# Patient Record
Sex: Male | Born: 1950 | Race: Black or African American | Hispanic: No | Marital: Married | Smoking: Never smoker
Health system: Southern US, Community
[De-identification: ages and names within clinical notes are randomized; demographics above are authoritative.]

## PROBLEM LIST (undated history)

## (undated) DIAGNOSIS — I10 Essential (primary) hypertension: Secondary | ICD-10-CM

## (undated) DIAGNOSIS — E785 Hyperlipidemia, unspecified: Secondary | ICD-10-CM

## (undated) HISTORY — PX: TOOTH EXTRACTION: SUR596

## (undated) HISTORY — PX: WISDOM TOOTH EXTRACTION: SHX21

## (undated) HISTORY — DX: Hyperlipidemia, unspecified: E78.5

## (undated) HISTORY — DX: Essential (primary) hypertension: I10

## (undated) HISTORY — PX: COLONOSCOPY: SHX174

---

## 2008-06-18 ENCOUNTER — Ambulatory Visit: Payer: Self-pay | Admitting: Gastroenterology

## 2019-09-11 ENCOUNTER — Other Ambulatory Visit: Payer: Self-pay | Admitting: Family Medicine

## 2019-09-11 DIAGNOSIS — R109 Unspecified abdominal pain: Secondary | ICD-10-CM

## 2019-09-12 ENCOUNTER — Other Ambulatory Visit: Payer: Self-pay

## 2019-09-12 ENCOUNTER — Ambulatory Visit
Admission: RE | Admit: 2019-09-12 | Discharge: 2019-09-12 | Disposition: A | Discharge status: Home / Self Care | Payer: Medicare HMO | Source: Ambulatory Visit | Attending: Family Medicine | Admitting: Family Medicine

## 2019-09-12 DIAGNOSIS — R109 Unspecified abdominal pain: Secondary | ICD-10-CM | POA: Insufficient documentation

## 2019-09-19 ENCOUNTER — Inpatient Hospital Stay
Admission: EM | Admit: 2019-09-19 | Discharge: 2019-09-27 | DRG: 871 | Disposition: A | Discharge status: Home / Self Care | Payer: Medicare HMO | Attending: Internal Medicine | Admitting: Internal Medicine

## 2019-09-19 ENCOUNTER — Emergency Department: Payer: Medicare HMO

## 2019-09-19 ENCOUNTER — Encounter: Payer: Self-pay | Admitting: Emergency Medicine

## 2019-09-19 ENCOUNTER — Other Ambulatory Visit: Payer: Self-pay

## 2019-09-19 ENCOUNTER — Observation Stay: Payer: Medicare HMO

## 2019-09-19 DIAGNOSIS — R7989 Other specified abnormal findings of blood chemistry: Secondary | ICD-10-CM

## 2019-09-19 DIAGNOSIS — J90 Pleural effusion, not elsewhere classified: Secondary | ICD-10-CM

## 2019-09-19 DIAGNOSIS — M899 Disorder of bone, unspecified: Secondary | ICD-10-CM | POA: Diagnosis present

## 2019-09-19 DIAGNOSIS — R590 Localized enlarged lymph nodes: Secondary | ICD-10-CM | POA: Diagnosis present

## 2019-09-19 DIAGNOSIS — Z880 Allergy status to penicillin: Secondary | ICD-10-CM

## 2019-09-19 DIAGNOSIS — C3432 Malignant neoplasm of lower lobe, left bronchus or lung: Secondary | ICD-10-CM | POA: Diagnosis present

## 2019-09-19 DIAGNOSIS — R109 Unspecified abdominal pain: Secondary | ICD-10-CM | POA: Diagnosis present

## 2019-09-19 DIAGNOSIS — J9600 Acute respiratory failure, unspecified whether with hypoxia or hypercapnia: Secondary | ICD-10-CM

## 2019-09-19 DIAGNOSIS — Z09 Encounter for follow-up examination after completed treatment for conditions other than malignant neoplasm: Secondary | ICD-10-CM

## 2019-09-19 DIAGNOSIS — Z79899 Other long term (current) drug therapy: Secondary | ICD-10-CM

## 2019-09-19 DIAGNOSIS — Z801 Family history of malignant neoplasm of trachea, bronchus and lung: Secondary | ICD-10-CM

## 2019-09-19 DIAGNOSIS — J869 Pyothorax without fistula: Secondary | ICD-10-CM

## 2019-09-19 DIAGNOSIS — Z20822 Contact with and (suspected) exposure to covid-19: Secondary | ICD-10-CM | POA: Diagnosis present

## 2019-09-19 DIAGNOSIS — A419 Sepsis, unspecified organism: Principal | ICD-10-CM | POA: Diagnosis present

## 2019-09-19 DIAGNOSIS — J9601 Acute respiratory failure with hypoxia: Secondary | ICD-10-CM | POA: Diagnosis not present

## 2019-09-19 DIAGNOSIS — Z938 Other artificial opening status: Secondary | ICD-10-CM

## 2019-09-19 DIAGNOSIS — J9811 Atelectasis: Secondary | ICD-10-CM | POA: Diagnosis present

## 2019-09-19 DIAGNOSIS — R918 Other nonspecific abnormal finding of lung field: Secondary | ICD-10-CM

## 2019-09-19 DIAGNOSIS — Z833 Family history of diabetes mellitus: Secondary | ICD-10-CM

## 2019-09-19 DIAGNOSIS — D72829 Elevated white blood cell count, unspecified: Secondary | ICD-10-CM | POA: Diagnosis not present

## 2019-09-19 DIAGNOSIS — Z87891 Personal history of nicotine dependence: Secondary | ICD-10-CM

## 2019-09-19 DIAGNOSIS — R079 Chest pain, unspecified: Secondary | ICD-10-CM

## 2019-09-19 DIAGNOSIS — J189 Pneumonia, unspecified organism: Secondary | ICD-10-CM | POA: Diagnosis present

## 2019-09-19 DIAGNOSIS — E1165 Type 2 diabetes mellitus with hyperglycemia: Secondary | ICD-10-CM | POA: Diagnosis present

## 2019-09-19 LAB — FIBRIN DERIVATIVES D-DIMER (ARMC ONLY): Fibrin derivatives D-dimer (ARMC): 1020.68 ng/mL (FEU) — ABNORMAL HIGH (ref 0.00–499.00)

## 2019-09-19 LAB — COMPREHENSIVE METABOLIC PANEL
ALT: 28 U/L (ref 0–44)
AST: 22 U/L (ref 15–41)
Albumin: 3.7 g/dL (ref 3.5–5.0)
Alkaline Phosphatase: 110 U/L (ref 38–126)
Anion gap: 12 (ref 5–15)
BUN: 21 mg/dL (ref 8–23)
CO2: 28 mmol/L (ref 22–32)
Calcium: 8.9 mg/dL (ref 8.9–10.3)
Chloride: 99 mmol/L (ref 98–111)
Creatinine, Ser: 1.26 mg/dL — ABNORMAL HIGH (ref 0.61–1.24)
GFR calc Af Amer: >60 mL/min (ref >60)
GFR calc non Af Amer: 59 mL/min — ABNORMAL LOW (ref >60)
Glucose, Bld: 217 mg/dL — ABNORMAL HIGH (ref 70–99)
Potassium: 3.6 mmol/L (ref 3.5–5.1)
Sodium: 139 mmol/L (ref 135–145)
Total Bilirubin: 1 mg/dL (ref 0.3–1.2)
Total Protein: 8.2 g/dL — ABNORMAL HIGH (ref 6.5–8.1)

## 2019-09-19 LAB — CBC WITH DIFFERENTIAL/PLATELET
Abs Immature Granulocytes: 0.05 10*3/uL (ref 0.00–0.07)
Basophils Absolute: 0.1 10*3/uL (ref 0.0–0.1)
Basophils Relative: 0 %
Eosinophils Absolute: 0 10*3/uL (ref 0.0–0.5)
Eosinophils Relative: 0 %
HCT: 42.3 % (ref 39.0–52.0)
Hemoglobin: 14.5 g/dL (ref 13.0–17.0)
Immature Granulocytes: 0 %
Lymphocytes Relative: 20 %
Lymphs Abs: 2.3 10*3/uL (ref 0.7–4.0)
MCH: 30.8 pg (ref 26.0–34.0)
MCHC: 34.3 g/dL (ref 30.0–36.0)
MCV: 89.8 fL (ref 80.0–100.0)
Monocytes Absolute: 0.6 10*3/uL (ref 0.1–1.0)
Monocytes Relative: 5 %
Neutro Abs: 8.6 10*3/uL — ABNORMAL HIGH (ref 1.7–7.7)
Neutrophils Relative %: 75 %
Platelets: 332 10*3/uL (ref 150–400)
RBC: 4.71 MIL/uL (ref 4.22–5.81)
RDW: 12.1 % (ref 11.5–15.5)
WBC: 11.7 10*3/uL — ABNORMAL HIGH (ref 4.0–10.5)
nRBC: 0 % (ref 0.0–0.2)

## 2019-09-19 LAB — BRAIN NATRIURETIC PEPTIDE: B Natriuretic Peptide: 30.8 pg/mL (ref 0.0–100.0)

## 2019-09-19 LAB — PROTIME-INR
INR: 1.1 (ref 0.8–1.2)
Prothrombin Time: 13.3 seconds (ref 11.4–15.2)

## 2019-09-19 LAB — SARS CORONAVIRUS 2 BY RT PCR (HOSPITAL ORDER, PERFORMED IN ~~LOC~~ HOSPITAL LAB): SARS Coronavirus 2: NEGATIVE

## 2019-09-19 LAB — GLUCOSE, CAPILLARY: Glucose-Capillary: 119 mg/dL — ABNORMAL HIGH (ref 70–99)

## 2019-09-19 LAB — APTT: aPTT: 36 seconds (ref 24–36)

## 2019-09-19 LAB — TROPONIN I (HIGH SENSITIVITY): Troponin I (High Sensitivity): 4 ng/L (ref <18)

## 2019-09-19 LAB — SEDIMENTATION RATE: Sed Rate: 46 mm/hr — ABNORMAL HIGH (ref 0–20)

## 2019-09-19 MED ORDER — ALBUTEROL SULFATE (2.5 MG/3ML) 0.083% IN NEBU
2.5000 mg | INHALATION_SOLUTION | RESPIRATORY_TRACT | Status: DC | PRN
  Administered 2019-09-21 – 2019-09-25 (×9): 2.5 mg via RESPIRATORY_TRACT
  Filled 2019-09-19 (×10): qty 3

## 2019-09-19 MED ORDER — ACETAMINOPHEN 325 MG PO TABS: 650.0000 mg | ORAL_TABLET | Freq: Four times a day (QID) | ORAL | Status: DC | PRN

## 2019-09-19 MED ORDER — OXYCODONE-ACETAMINOPHEN 5-325 MG PO TABS
1.0000 | ORAL_TABLET | ORAL | Status: DC | PRN
  Administered 2019-09-19 – 2019-09-26 (×6): 1 via ORAL
  Filled 2019-09-19 (×6): qty 1

## 2019-09-19 MED ORDER — ONDANSETRON HCL 4 MG/2ML IJ SOLN: 4.0000 mg | Freq: Three times a day (TID) | INTRAMUSCULAR | Status: DC | PRN

## 2019-09-19 MED ORDER — OXYCODONE-ACETAMINOPHEN 5-325 MG PO TABS
1.0000 | ORAL_TABLET | Freq: Once | ORAL | Status: AC
  Administered 2019-09-19: 1 via ORAL
  Filled 2019-09-19: qty 1

## 2019-09-19 MED ORDER — MORPHINE SULFATE (PF) 2 MG/ML IV SOLN
2.0000 mg | INTRAVENOUS | Status: DC | PRN
  Administered 2019-09-19 – 2019-09-20 (×3): 2 mg via INTRAVENOUS
  Filled 2019-09-19 (×3): qty 1

## 2019-09-19 MED ORDER — SODIUM CHLORIDE 0.9 % IV SOLN
500.0000 mg | INTRAVENOUS | Status: DC
  Administered 2019-09-19 – 2019-09-23 (×5): 500 mg via INTRAVENOUS
  Filled 2019-09-19 (×6): qty 500

## 2019-09-19 MED ORDER — IOHEXOL 350 MG/ML SOLN
75.0000 mL | Freq: Once | INTRAVENOUS | Status: AC | PRN
  Administered 2019-09-19: 75 mL via INTRAVENOUS

## 2019-09-19 MED ORDER — DM-GUAIFENESIN ER 30-600 MG PO TB12: 1.0000 | ORAL_TABLET | Freq: Two times a day (BID) | ORAL | Status: DC | PRN

## 2019-09-19 MED ORDER — SODIUM CHLORIDE 0.9 % IV SOLN
1.0000 g | INTRAVENOUS | Status: DC
  Administered 2019-09-19: 1 g via INTRAVENOUS
  Filled 2019-09-19 (×2): qty 10

## 2019-09-19 NOTE — ED Notes (Signed)
Dr Cinda Quest in triage to evaluate pt; pt grunting with any movement

## 2019-09-19 NOTE — ED Triage Notes (Addendum)
Pt to triage via w/c, appears very uncomfortable, grimacing; pt st last month having pain to left side, has been taking tylenol for relief; this morning awoke about 230am and began having worsening pain and took naproxen without relief; denies back/abd or chest pain; denies any accomp symptoms but pt appears to be breathing shallow with grunting resp; st he has been seen for such and has appt with pulmonology on Mon

## 2019-09-19 NOTE — H&P (Addendum)
History and Physical    Alan Wolf XBM:[id number] DOB: April 06, 1950 DOA: 09/19/2019  Referring MD/NP/PA:   PCP: Marguerita Merles, MD   Patient coming from:  The patient is coming from home.  At baseline, pt is independent for most of ADL.        Chief Complaint: Left flank pain  HPI: Alan Wolf is a 69 y.o. male without significant past medical history, who presents with left flank pain.  Patient states that he has been having left flank pain for almost 1 month.  The pain is located in the left flank area, constant, sharp, moderate to severe, nonradiating.  It is aggravated by movement or deep breath.  Patient has mild cough and mild shortness of breath.  Occasionally he coughs up streaks of pink-colored mucus.  Denies fever or chills. He was initially seen and thought to have pneumonia on chest x-ray. He got 2 different antibiotics but is not any better.  Patient does not have nausea, vomiting, diarrhea, abdominal pain, symptoms of UTI or unilateral weakness.   Pt was seen in ED 09/12/19, and had CT-renal stone protocol which showed small LEFT pleural effusion and a small nonspecific lucent focus within the posterior aspect of the L4 vertebral body. Pt states that he has appointment with pulmonologist next Monday.  ED Course: pt was found to have WBC 11.7, troponin level 4, BNP 30.8, ESR 46, pending Covid PCR, D-dimer 1020, Cre 1.26 and BUN 21, GFR>60.  Temperature normal, blood pressure 121/72, heart rate 67, RR 16, oxygen saturation 97% on room air.  CT angiogram of chest: 1. Concern for 5.7 cm left lower lobe mass with bronchial adenopathy. Recommend referral to multi disciplinary thoracic oncology. Findings are not definitive for malignancy and could be related to round atelectasis or atypical pneumonia, consider antibiotic therapy in the meantime. 2. Small left pleural effusion complicated by areas of loculation. 3. Negative for pulmonary embolism.   Review of Systems:    General: no fevers, chills, no body weight gain, has fatigue HEENT: no blurry vision, hearing changes or sore throat Respiratory: has dyspnea, coughing, no wheezing CV: no chest pain, no palpitations GI: no nausea, vomiting, abdominal pain, diarrhea, constipation GU: no dysuria, burning on urination, increased urinary frequency, hematuria  Ext: no leg edema Neuro: no unilateral weakness, numbness, or tingling, no vision change or hearing loss Skin: no rash, no skin tear. MSK: No muscle spasm, no deformity, no limitation of range of movement in spin. Has left flank pain. Heme: No easy bruising.  Travel history: No recent long distant travel.  Allergy:  Allergies  Allergen Reactions  . Penicillins     History reviewed. No pertinent past medical history.  History reviewed. No pertinent surgical history.  Social History:  reports that he has never smoked. He has never used smokeless tobacco. He reports that he does not drink alcohol and does not use drugs.  Family History:  Family History  Problem Relation Age of Onset  . Diabetes Mellitus II Mother   . Lung cancer Brother      Prior to Admission medications   Medication Sig Start Date End Date Taking? Authorizing Provider  guaiFENesin-codeine 100-10 MG/5ML syrup Take 5 mLs by mouth as directed. 09/09/19  Yes [provider]  naproxen (NAPROSYN) 500 MG tablet Take 500 mg by mouth 2 (two) times daily as needed. 09/12/19  Yes [provider]    Physical Exam: Vitals:   09/19/19 0458 09/19/19 0650 09/19/19 0745 09/19/19 0900  BP: Marland Kitchen)  118/100 121/72 (!) 131/70 125/70  Pulse: 80 67 76 72  Resp: '18 18 18   ' Temp:  98.7 F (37.1 C) 98.3 F (36.8 C)   TempSrc: Oral Oral Oral   SpO2: 93% 97% 95% 90%  Weight: 70.3 kg     Height: '5\' 11"'  (1.803 m)      General: Not in acute distress HEENT:       Eyes: PERRL, EOMI, no scleral icterus.       ENT: No discharge from the ears and nose, no pharynx injection, no  tonsillar enlargement.        Neck: No JVD, no bruit, no mass felt. Heme: No neck lymph node enlargement. Cardiac: S1/S2, RRR, No murmurs, No gallops or rubs. Respiratory: No rales, wheezing, rhonchi or rubs. GI: Soft, nondistended, nontender, no rebound pain, no organomegaly, BS present. GU: No hematuria Ext: No pitting leg edema bilaterally. 2+DP/PT pulse bilaterally. Musculoskeletal: No joint deformities, No joint redness or warmth, no limitation of ROM in spin. Skin: No rashes.  Neuro: Alert, oriented X3, cranial nerves II-XII grossly intact, moves all extremities normally.  Psych: Patient is not psychotic, no suicidal or hemocidal ideation.  Labs on Admission: I have personally reviewed following labs and imaging studies  CBC: Recent Labs  Lab 09/19/19 0518  WBC 11.7*  NEUTROABS 8.6*  HGB 14.5  HCT 42.3  MCV 89.8  PLT 314   Basic Metabolic Panel: Recent Labs  Lab 09/19/19 0518  NA 139  K 3.6  CL 99  CO2 28  GLUCOSE 217*  BUN 21  CREATININE 1.26*  CALCIUM 8.9   GFR: Estimated Creatinine Clearance: 56.6 mL/min (A) (by C-G formula based on SCr of 1.26 mg/dL (H)). Liver Function Tests: Recent Labs  Lab 09/19/19 0518  AST 22  ALT 28  ALKPHOS 110  BILITOT 1.0  PROT 8.2*  ALBUMIN 3.7   No results for input(s): LIPASE, AMYLASE in the last 168 hours. No results for input(s): AMMONIA in the last 168 hours. Coagulation Profile: No results for input(s): INR, PROTIME in the last 168 hours. Cardiac Enzymes: No results for input(s): CKTOTAL, CKMB, CKMBINDEX, TROPONINI in the last 168 hours. BNP (last 3 results) No results for input(s): PROBNP in the last 8760 hours. HbA1C: No results for input(s): HGBA1C in the last 72 hours. CBG: Recent Labs  Lab 09/19/19 0927  GLUCAP 119*   Lipid Profile: No results for input(s): CHOL, HDL, LDLCALC, TRIG, CHOLHDL, LDLDIRECT in the last 72 hours. Thyroid Function Tests: No results for input(s): TSH, T4TOTAL, FREET4,  T3FREE, THYROIDAB in the last 72 hours. Anemia Panel: No results for input(s): VITAMINB12, FOLATE, FERRITIN, TIBC, IRON, RETICCTPCT in the last 72 hours. Urine analysis: No results found for: COLORURINE, APPEARANCEUR, LABSPEC, PHURINE, GLUCOSEU, HGBUR, BILIRUBINUR, KETONESUR, PROTEINUR, UROBILINOGEN, NITRITE, LEUKOCYTESUR Sepsis Labs: '@LABRCNTIP' (procalcitonin:4,lacticidven:4) ) Recent Results (from the past 240 hour(s))  SARS Coronavirus 2 by RT PCR (hospital order, performed in [facility] hospital lab) Nasopharyngeal Nasopharyngeal Swab     Status: None   Collection Time: 09/19/19  8:40 AM   Specimen: Nasopharyngeal Swab  Result Value Ref Range Status   SARS Coronavirus 2 NEGATIVE NEGATIVE Final    Comment: (NOTE) SARS-CoV-2 target nucleic acids are NOT DETECTED.  The SARS-CoV-2 RNA is generally detectable in upper and lower respiratory specimens during the acute phase of infection. The lowest concentration of SARS-CoV-2 viral copies this assay can detect is 250 copies / mL. A negative result does not preclude SARS-CoV-2 infection and should not be  used as the sole basis for treatment or other patient management decisions.  A negative result may occur with improper specimen collection / handling, submission of specimen other than nasopharyngeal swab, presence of viral mutation(s) within the areas targeted by this assay, and inadequate number of viral copies (<250 copies / mL). A negative result must be combined with clinical observations, patient history, and epidemiological information.  Fact Sheet for Patients:   StrictlyIdeas.no  Fact Sheet for Healthcare Providers: BankingDealers.co.za  This test is not yet approved or  cleared by the [place] FDA and has been authorized for detection and/or diagnosis of SARS-CoV-2 by FDA under an Emergency Use Authorization (EUA).  This EUA will remain in effect (meaning this test can be  used) for the duration of the COVID-19 declaration under Section 564(b)(1) of the Act, 21 U.S.C. section 360bbb-3(b)(1), unless the authorization is terminated or revoked sooner.  Performed at [facility], [address] [place], [place] [address]      Radiological Exams on Admission: CT Angio Chest PE W and/or Wo Contrast  Result Date: 09/19/2019 CLINICAL DATA:  Chest pain with pleurisy or effusion suspected EXAM: CT ANGIOGRAPHY CHEST WITH CONTRAST TECHNIQUE: Multidetector CT imaging of the chest was performed using the standard protocol during bolus administration of intravenous contrast. Multiplanar CT image reconstructions and MIPs were obtained to evaluate the vascular anatomy. CONTRAST:  53m OMNIPAQUE IOHEXOL 350 MG/ML SOLN COMPARISON:  Abdominal CT 09/12/2019 FINDINGS: Cardiovascular: Normal heart size. No pericardial effusion. Atheromatous calcification along the aorta and left main coronary. Mediastinum/Nodes: Mildly enlarged bronchial lymph nodes the left lower lobe, 11 mm in diameter. Lungs/Pleura: Ovoid area of decreased enhancement and vessel attenuation measuring up to 5.7 cm on axial slices. There is adjacent enhancing compressed appearing lung. Small left pleural effusion with some scalloping. Upper Abdomen: Negative Musculoskeletal: Negative for bone erosion. No evidence of osseous metastatic disease. Review of the MIP images confirms the above findings. IMPRESSION: 1. Concern for left lower lobe mass with bronchial adenopathy. Recommend referral to multi disciplinary thoracic oncology. Findings are not definitive for malignancy and could be related to round atelectasis or atypical pneumonia, consider antibiotic therapy in the meantime. 2. Small left pleural effusion complicated by areas of loculation. 3. Negative for pulmonary embolism. Electronically Signed   By: JMonte FantasiaM.D.   On: 09/19/2019 06:44     EKG: Independently reviewed.  Sinus rhythm, QTC 394, LAE,  nonspecific T wave change  Assessment/Plan Principal Problem:   Lung mass Active Problems:   Leukocytosis   Left flank pain   Lung mass:  CTA showed 5.7 cm left lower lobe mass with bronchial adenopathy. CT also showed small left pleural effusion complicated by areas of loculation.  Etiology is not clear. Pt had CT-renal stone protocol on 7/22, which showed small LEFT pleural effusion and small nonspecific lucent focus within the posterior aspect of the L4 vertebral body. Differential diagnosis include malignancy, atypical infection.  Oncology, Dr. BRogue Bussingis consulted.  Also consulted pulmonologist, Dr. ALanney Gins -->Dr. FRaul Delwill see pt tomorrow  -Placed on MedSurg bed for observation -Start empiric antibiotics: Rocephin and azithromycin -Blood culture -Bronchodilators as needed -As needed Mucinex for cough - will get LE doppler to r/o DVT due to positive D-dimer.  Leukocytosis: WBC 11.7 -on antibiotics as above  Left flank pain: Due to left lung mass -As needed Percocet and morphine for pain    DVT ppx: SCD Code Status: Full code Family Communication:   Yes, patient's daughter at  bed side Disposition Plan:  Anticipate discharge back to previous environment Consults called: Dr. Rogue Bussing of oncology; pulmonologist, Dr. Lanney Gins  -->Dr. Raul Del will see pt tomorrow Admission status: Med-surg bed for obs    Status is: Observation  The patient remains OBS appropriate and will d/c before 2 midnights.  Dispo: The patient is from: Home              Anticipated d/c is to: Home              Anticipated d/c date is: 1 day              Patient currently is not medically stable to d/c.           Date of Service 09/19/2019    [name] [facility]   If 7PM-7AM, please contact night-coverage [email or web address] 09/19/2019, 12:17 PM

## 2019-09-19 NOTE — Assessment & Plan Note (Addendum)
#  69 year old male patient with no history of smoking is currently admitted to hospital for left-sided chest wall pain  #Left-sided chest wall pain/pleuritic nature-likely secondary to underlying left lower lobe mass-etiology is unclear infection versus atelectasis versus malignancy.  Currently on IV antibiotics.  #Incidental L4-lytic lesion-noted on renal protocol CT  Recommendations:  #Agree with current plan of care with IV antibiotics for possible infection left lower lobe.  Recommend pulmonary evaluation.   #Regards to L4 lytic lesion-recommend myeloma work-up; skeletal survey.   Thank you Dr.Niu for allowing me to participate in the care of your pleasant patient. Please do not hesitate to contact me with questions or concerns in the interim.  # I reviewed the blood work- with the patient in detail; also reviewed the imaging independently [as summarized above]; and with the patient in detail.

## 2019-09-19 NOTE — Consult Note (Signed)
[place] NOTE  Patient Care Team: Marguerita Merles, MD as PCP - General (Family Medicine)  CHIEF COMPLAINTS/PURPOSE OF CONSULTATION: Lung mass  HISTORY OF PRESENTING ILLNESS:  Alan Wolf 69 y.o.  male with no significant past medical history; no history of smoking is currently admitted to hospital for left chest wall pain.   Patient noted to have left chest wall pain "12 on a scale of 10"-which was pleuritic.  Of note patient was having fevers on and off for the last 4 weeks. He also had a CT scan renal protocol-negative for kidney stone recently as outpatient.  Patient was also treated with antibiotics.  CT scan in the emergency room showed-up to 5 cm lower lobe mass atelectasis versus malignancy.  Oncology has been consulted for further evaluation recommendations.  Review of Systems  Constitutional: Positive for diaphoresis, fever and malaise/fatigue. Negative for chills and weight loss.  HENT: Negative for nosebleeds and sore throat.   Eyes: Negative for double vision.  Respiratory: Positive for cough. Negative for hemoptysis, sputum production, shortness of breath and wheezing.   Cardiovascular: Positive for chest pain. Negative for palpitations, orthopnea and leg swelling.  Gastrointestinal: Negative for abdominal pain, blood in stool, constipation, diarrhea, heartburn, melena, nausea and vomiting.  Genitourinary: Negative for dysuria, frequency and urgency.  Musculoskeletal: Negative for back pain and joint pain.  Skin: Negative.  Negative for itching and rash.  Neurological: Negative for dizziness, tingling, focal weakness, weakness and headaches.  Endo/Heme/Allergies: Does not bruise/bleed easily.  Psychiatric/Behavioral: Negative for depression. The patient is not nervous/anxious and does not have insomnia.      MEDICAL HISTORY:  History reviewed. No pertinent past medical history.  SURGICAL HISTORY: History reviewed. No pertinent surgical  history.  SOCIAL HISTORY: Social History   Socioeconomic History  . Marital status: Married    Spouse name: Not on file  . Number of children: Not on file  . Years of education: Not on file  . Highest education level: Not on file  Occupational History  . Not on file  Tobacco Use  . Smoking status: Never Smoker  . Smokeless tobacco: Never Used  Substance and Sexual Activity  . Alcohol use: Never  . Drug use: Never  . Sexual activity: Not on file  Other Topics Concern  . Not on file  Social History Narrative   Remote smoker 30ppd; quit year ago; [place]/family-wife [dementia] used to work in Tourist information centre manager.    Social Determinants of Health   Financial Resource Strain:   . Difficulty of Paying Living Expenses:   Food Insecurity:   . Worried About Charity fundraiser in the Last Year:   . Arboriculturist in the Last Year:   Transportation Needs:   . Film/video editor (Medical):   Marland Kitchen Lack of Transportation (Non-Medical):   Physical Activity:   . Days of Exercise per Week:   . Minutes of Exercise per Session:   Stress:   . Feeling of Stress :   Social Connections:   . Frequency of Communication with Friends and Family:   . Frequency of Social Gatherings with Friends and Family:   . Attends Religious Services:   . Active Member of Clubs or Organizations:   . Attends Archivist Meetings:   Marland Kitchen Marital Status:   Intimate Partner Violence:   . Fear of Current or Ex-Partner:   . Emotionally Abused:   Marland Kitchen Physically Abused:   . Sexually Abused:  FAMILY HISTORY: Family History  Problem Relation Age of Onset  . Diabetes Mellitus II Mother   . Lung cancer Brother     ALLERGIES:  is allergic to penicillins.  MEDICATIONS:  Current Facility-Administered Medications  Medication Dose Route Frequency Provider Last Rate Last Admin  . acetaminophen (TYLENOL) tablet 650 mg  650 mg Oral Q6H PRN [name], MD      . albuterol (PROVENTIL) (2.5 MG/3ML) 0.083%  nebulizer solution 2.5 mg  2.5 mg Nebulization Q4H PRN [name], MD      . azithromycin (ZITHROMAX) 500 mg in sodium chloride 0.9 % 250 mL IVPB  500 mg Intravenous Q24H [name], MD 250 mL/hr at 09/19/19 1749 500 mg at 09/19/19 1749  . cefTRIAXone (ROCEPHIN) 1 g in sodium chloride 0.9 % 100 mL IVPB  1 g Intravenous Q24H [name], MD      . dextromethorphan-guaiFENesin [facility] DM) 30-600 MG per 12 hr tablet 1 tablet  1 tablet Oral BID PRN [name], MD      . morphine 2 MG/ML injection 2 mg  2 mg Intravenous Q4H PRN [name], MD   2 mg at 09/19/19 1743  . ondansetron (ZOFRAN) injection 4 mg  4 mg Intravenous Q8H PRN [name], MD      . oxyCODONE-acetaminophen (PERCOCET/ROXICET) 5-325 MG per tablet 1 tablet  1 tablet Oral Q4H PRN [name], MD   1 tablet at 09/19/19 1625   Current Outpatient Medications  Medication Sig Dispense Refill  . guaiFENesin-codeine 100-10 MG/5ML syrup Take 5 mLs by mouth as directed.    . naproxen (NAPROSYN) 500 MG tablet Take 500 mg by mouth 2 (two) times daily as needed.        Marland Kitchen  PHYSICAL EXAMINATION:  Vitals:   09/19/19 0900 09/19/19 1830  BP: 125/70   Pulse: 72   Resp:    Temp:  99.4 F (37.4 C)  SpO2: 90%    Filed Weights   09/19/19 0458  Weight: 155 lb (70.3 kg)    Physical Exam HENT:     Head: Normocephalic and atraumatic.     Mouth/Throat:     Pharynx: No oropharyngeal exudate.  Eyes:     Pupils: Pupils are equal, round, and reactive to light.  Cardiovascular:     Rate and Rhythm: Normal rate and regular rhythm.  Pulmonary:     Effort: Pulmonary effort is normal. No respiratory distress.     Breath sounds: No wheezing.     Comments: Decreased breath sounds on left side.  Abdominal:     General: Bowel sounds are normal. There is no distension.     Palpations: Abdomen is soft. There is no mass.     Tenderness: There is no abdominal tenderness. There is no guarding or rebound.  Musculoskeletal:        General: No  tenderness. Normal range of motion.     Cervical back: Normal range of motion and neck supple.  Skin:    General: Skin is warm.  Neurological:     Mental Status: He is alert and oriented to person, place, and time.  Psychiatric:        Mood and Affect: Affect normal.      LABORATORY DATA:  I have reviewed the data as listed Lab Results  Component Value Date   WBC 11.7 (H) 09/19/2019   HGB 14.5 09/19/2019   HCT 42.3 09/19/2019   MCV 89.8 09/19/2019   PLT 332 09/19/2019   Recent Labs  09/19/19 0518  NA 139  K 3.6  CL 99  CO2 28  GLUCOSE 217*  BUN 21  CREATININE 1.26*  CALCIUM 8.9  GFRNONAA 59*  GFRAA >60  PROT 8.2*  ALBUMIN 3.7  AST 22  ALT 28  ALKPHOS 110  BILITOT 1.0    RADIOGRAPHIC STUDIES: I have personally reviewed the radiological images as listed and agreed with the findings in the report. CT Angio Chest PE W and/or Wo Contrast  Result Date: 09/19/2019 CLINICAL DATA:  Chest pain with pleurisy or effusion suspected EXAM: CT ANGIOGRAPHY CHEST WITH CONTRAST TECHNIQUE: Multidetector CT imaging of the chest was performed using the standard protocol during bolus administration of intravenous contrast. Multiplanar CT image reconstructions and MIPs were obtained to evaluate the vascular anatomy. CONTRAST:  61mL OMNIPAQUE IOHEXOL 350 MG/ML SOLN COMPARISON:  Abdominal CT 09/12/2019 FINDINGS: Cardiovascular: Normal heart size. No pericardial effusion. Atheromatous calcification along the aorta and left main coronary. Mediastinum/Nodes: Mildly enlarged bronchial lymph nodes the left lower lobe, 11 mm in diameter. Lungs/Pleura: Ovoid area of decreased enhancement and vessel attenuation measuring up to 5.7 cm on axial slices. There is adjacent enhancing compressed appearing lung. Small left pleural effusion with some scalloping. Upper Abdomen: Negative Musculoskeletal: Negative for bone erosion. No evidence of osseous metastatic disease. Review of the MIP images confirms the  above findings. IMPRESSION: 1. Concern for left lower lobe mass with bronchial adenopathy. Recommend referral to multi disciplinary thoracic oncology. Findings are not definitive for malignancy and could be related to round atelectasis or atypical pneumonia, consider antibiotic therapy in the meantime. 2. Small left pleural effusion complicated by areas of loculation. 3. Negative for pulmonary embolism. Electronically Signed   By: Monte Fantasia M.D.   On: 09/19/2019 06:44   US Venous Img Lower Bilateral (DVT)  Result Date: 09/19/2019 CLINICAL DATA:  Elevated D-dimer.  Evaluate for DVT. EXAM: BILATERAL LOWER EXTREMITY VENOUS DOPPLER ULTRASOUND TECHNIQUE: Gray-scale sonography with graded compression, as well as color Doppler and duplex ultrasound were performed to evaluate the lower extremity deep venous systems from the level of the common femoral vein and including the common femoral, femoral, profunda femoral, popliteal and calf veins including the posterior tibial, peroneal and gastrocnemius veins when visible. The superficial great saphenous vein was also interrogated. Spectral Doppler was utilized to evaluate flow at rest and with distal augmentation maneuvers in the common femoral, femoral and popliteal veins. COMPARISON:  None. FINDINGS: RIGHT LOWER EXTREMITY Common Femoral Vein: No evidence of thrombus. Normal compressibility, respiratory phasicity and response to augmentation. Saphenofemoral Junction: No evidence of thrombus. Normal compressibility and flow on color Doppler imaging. Profunda Femoral Vein: No evidence of thrombus. Normal compressibility and flow on color Doppler imaging. Femoral Vein: No evidence of thrombus. Normal compressibility, respiratory phasicity and response to augmentation. Popliteal Vein: No evidence of thrombus. Normal compressibility, respiratory phasicity and response to augmentation. Calf Veins: No evidence of thrombus. Normal compressibility and flow on color Doppler  imaging. Superficial Great Saphenous Vein: No evidence of thrombus. Normal compressibility. Venous Reflux:  None. Other Findings:  None. LEFT LOWER EXTREMITY Common Femoral Vein: No evidence of thrombus. Normal compressibility, respiratory phasicity and response to augmentation. Saphenofemoral Junction: No evidence of thrombus. Normal compressibility and flow on color Doppler imaging. Profunda Femoral Vein: No evidence of thrombus. Normal compressibility and flow on color Doppler imaging. Femoral Vein: No evidence of thrombus. Normal compressibility, respiratory phasicity and response to augmentation. Popliteal Vein: No evidence of thrombus. Normal compressibility, respiratory phasicity and response to augmentation. Calf  Veins: No evidence of thrombus. Normal compressibility and flow on color Doppler imaging. Superficial Great Saphenous Vein: No evidence of thrombus. Normal compressibility. Venous Reflux:  None. Other Findings:  None. IMPRESSION: No evidence of DVT within either lower extremity. Electronically Signed   By: Sandi Mariscal M.D.   On: 09/19/2019 12:29   CT RENAL STONE STUDY  Result Date: 09/12/2019 CLINICAL DATA:  LEFT flank and LEFT lower quadrant pain for 10-12 days, no history of kidney stones or surgery EXAM: CT ABDOMEN AND PELVIS WITHOUT CONTRAST TECHNIQUE: Multidetector CT imaging of the abdomen and pelvis was performed following the standard protocol without IV contrast. Sagittal and coronal MPR images reconstructed from axial data set. No oral contrast. COMPARISON:  None FINDINGS: Lower chest: Small LEFT pleural effusion with partial atelectasis of LEFT lower lobe. Hepatobiliary: Gallbladder and liver normal appearance Pancreas: Normal appearance Spleen: Normal appearance Adrenals/Urinary Tract: Adrenal glands, kidneys, ureters, and bladder normal appearance per Stomach/Bowel: Normal appendix. Minimally prominent stool in rectum. Large and small bowel loops otherwise unremarkable. No definite  gastric abnormalities. Vascular/Lymphatic: Atherosclerotic calcifications aorta and iliac arteries without aneurysm. No adenopathy. Reproductive: Prostatic enlargement, gland measuring 4.7 x 4.3 cm image 72. Other: No free air or free fluid. No inflammatory process. Small RIGHT inguinal hernia containing fat. Musculoskeletal: Small lucent focus within posterior aspect of L4 vertebral body RIGHT of midline, nonspecific. No additional osseous abnormalities. IMPRESSION: Small LEFT pleural effusion with partial atelectasis of LEFT lower lobe. Small RIGHT inguinal hernia containing fat. Prostatic enlargement. No acute intra-abdominal or intrapelvic abnormalities. Small nonspecific lucent focus within the posterior aspect of the L4 vertebral body, nonspecific, cannot exclude myeloma or potentially lytic metastasis; correlation with workup for myeloma recommended. Aortic Atherosclerosis (ICD10-I70.0). Electronically Signed   By: Lavonia Dana M.D.   On: 09/12/2019 11:00    Mass of lower lobe of left lung #69 year old male patient with no history of smoking is currently admitted to hospital for left-sided chest wall pain  #Left-sided chest wall pain/pleuritic nature-likely secondary to underlying left lower lobe mass-etiology is unclear infection versus atelectasis versus malignancy.  Currently on IV antibiotics.  #Incidental L4-lytic lesion-noted on renal protocol CT  Recommendations:  #Agree with current plan of care with IV antibiotics for possible infection left lower lobe.  Recommend pulmonary evaluation.   #Regards to L4 lytic lesion-recommend myeloma work-up; skeletal survey.   Thank you Dr.Niu for allowing me to participate in the care of your pleasant patient. Please do not hesitate to contact me with questions or concerns in the interim.  # I reviewed the blood work- with the patient in detail; also reviewed the imaging independently [as summarized above]; and with the patient in detail.      All questions were answered. The patient knows to call the clinic with any problems, questions or concerns.    Cammie Sickle, MD 09/19/2019 7:13 PM

## 2019-09-19 NOTE — ED Notes (Signed)
Pt to subwait, assisted into recliner with warm blanket, call bell within reach; pt voices understanding of plan of care

## 2019-09-19 NOTE — Progress Notes (Signed)
Patient AAO x 4 with daughter @ bedside. Denies pain and discomfort @ this time. Vital signs stable Report given to Tamera on 2C.

## 2019-09-19 NOTE — ED Provider Notes (Signed)
[facility] Emergency Department Provider Note   ____________________________________________   None    (approximate)  I have reviewed the triage vital signs and the nursing notes.   HISTORY  Chief Complaint Chest Pain   HPI Alan Wolf is a 69 y.o. male who complains of 1 month of severe left-sided pain.  Pain is worse with deep breathing or movement.  It is in his left chest and left side of abdomen.  He was initially seen and thought to have pneumonia on chest x-ray he got 2 different antibiotics but is not any better.  He had a CT of the abdomen done which showed a left-sided pleural effusion but no kidney stones.  Today his pain got much worse so he came back to the emergency room.  He is not having a fever.  He does complain of a cough which is occasionally productive of pink phlegm.Marland Kitchen          History reviewed. No pertinent past medical history.  There are no problems to display for this patient.   History reviewed. No pertinent surgical history.  Prior to Admission medications   Not on File    Allergies Penicillins  No family history on file.  Social History Social History   Tobacco Use  . Smoking status: Not on file  Substance Use Topics  . Alcohol use: Not on file  . Drug use: Not on file    Review of Systems  Constitutional: No fever/chills Eyes: No visual changes. ENT: No sore throat. Cardiovascular:  chest pain. Respiratory: Some shortness of breath mostly due to the pain. Gastrointestinal: Left-sided abdominal pain.  No nausea, no vomiting.  No diarrhea.  No constipation. Genitourinary: Negative for dysuria. Musculoskeletal: Negative for back pain. Skin: Negative for rash. Neurological: Negative for headaches, focal weakness  ____________________________________________   PHYSICAL EXAM:  VITAL SIGNS: ED Triage Vitals [09/19/19 0458]  Enc Vitals Group     BP (!) 118/100     Pulse Rate 80     Resp 18      Temp      Temp Source Oral     SpO2 93 %     Weight 155 lb (70.3 kg)     Height 5\' 11"  (1.803 m)     Head Circumference      Peak Flow      Pain Score 10     Pain Loc      Pain Edu?      Excl. in [place]?     Constitutional: Alert and oriented.  Patient sitting leaning on the right side of his chest.  He complains of pain in the left side of his chest with deep breathing or movement.  He looks very uncomfortable. Eyes: Conjunctivae are normal.  Head: Atraumatic. Nose: No congestion/rhinnorhea. Mouth/Throat: Mucous membranes are moist.  Oropharynx non-erythematous. Neck: No stridor.  Cardiovascular: Normal rate, regular rhythm. Grossly normal heart sounds.  Good peripheral circulation. Respiratory: Normal respiratory effort.  No retractions. Lungs CTAB. Gastrointestinal: Soft and nontender. No distention. No abdominal bruits. No CVA tenderness. Musculoskeletal: No lower extremity tenderness nor edema.   Neurologic:  Normal speech and language. No gross focal neurologic deficits are appreciated.  Skin:  Skin is warm, dry and intact. No rash noted.   ____________________________________________   LABS (all labs ordered are listed, but only abnormal results are displayed)  Labs Reviewed  CBC WITH DIFFERENTIAL/PLATELET - Abnormal; Notable for the following components:      Result Value  WBC 11.7 (*)    Neutro Abs 8.6 (*)    All other components within normal limits  COMPREHENSIVE METABOLIC PANEL - Abnormal; Notable for the following components:   Glucose, Bld 217 (*)    Creatinine, Ser 1.26 (*)    Total Protein 8.2 (*)    GFR calc non Af Amer 59 (*)    All other components within normal limits  SEDIMENTATION RATE - Abnormal; Notable for the following components:   Sed Rate 46 (*)    All other components within normal limits  SARS CORONAVIRUS 2 BY RT PCR (HOSPITAL ORDER, [place] [facility])  BRAIN NATRIURETIC PEPTIDE  FIBRIN DERIVATIVES D-DIMER ([facility] ONLY)    TROPONIN I (HIGH SENSITIVITY)   ____________________________________________  EKG  EKG read interpreted by me shows normal sinus rhythm normal axis rate of 79 no acute ST-T changes ____________________________________________  RADIOLOGY  ED MD interpretation:   Official radiology report(s): CT Angio Chest PE W and/or Wo Contrast  Result Date: 09/19/2019 CLINICAL DATA:  Chest pain with pleurisy or effusion suspected EXAM: CT ANGIOGRAPHY CHEST WITH CONTRAST TECHNIQUE: Multidetector CT imaging of the chest was performed using the standard protocol during bolus administration of intravenous contrast. Multiplanar CT image reconstructions and MIPs were obtained to evaluate the vascular anatomy. CONTRAST:  49mL OMNIPAQUE IOHEXOL 350 MG/ML SOLN COMPARISON:  Abdominal CT 09/12/2019 FINDINGS: Cardiovascular: Normal heart size. No pericardial effusion. Atheromatous calcification along the aorta and left main coronary. Mediastinum/Nodes: Mildly enlarged bronchial lymph nodes the left lower lobe, 11 mm in diameter. Lungs/Pleura: Ovoid area of decreased enhancement and vessel attenuation measuring up to 5.7 cm on axial slices. There is adjacent enhancing compressed appearing lung. Small left pleural effusion with some scalloping. Upper Abdomen: Negative Musculoskeletal: Negative for bone erosion. No evidence of osseous metastatic disease. Review of the MIP images confirms the above findings. IMPRESSION: 1. Concern for left lower lobe mass with bronchial adenopathy. Recommend referral to multi disciplinary thoracic oncology. Findings are not definitive for malignancy and could be related to round atelectasis or atypical pneumonia, consider antibiotic therapy in the meantime. 2. Small left pleural effusion complicated by areas of loculation. 3. Negative for pulmonary embolism. Electronically Signed   By: Monte Fantasia M.D.   On: 09/19/2019 06:44     ____________________________________________   PROCEDURES  Procedure(s) performed (including Critical Care): Critical care time 20 minutes.  This involves going out to triage to see this gentleman reviewing his old records examining him and then talking to the hospitalist.  Procedures   ____________________________________________   INITIAL IMPRESSION / [place] / ED COURSE  Patient with cough pleuritic chest pain some sputum.  He does have a left-sided pleural effusion on CT from the 22nd.  He will get some lab work.  Items to consider would be a worsening pneumonia or pulmonary embolus or pleurisy.     ----------------------------------------- 7:01 AM on 09/19/2019 -----------------------------------------  CT shows what appears to be a lung mass.  CT from several days ago also shows a lytic lesion in the spine which may be metastasis.  We will get this gentleman in the hospital and work him up and see what is going on.  He will also need pain control         ____________________________________________   FINAL CLINICAL IMPRESSION(S) / ED DIAGNOSES  Final diagnoses:  Chest pain, unspecified type  Lung mass     ED Discharge Orders    None       Note:  This document was prepared using Dragon voice recognition software and may include unintentional dictation errors.    Nena Polio, MD 09/19/19 [phone]

## 2019-09-20 DIAGNOSIS — A419 Sepsis, unspecified organism: Secondary | ICD-10-CM | POA: Diagnosis present

## 2019-09-20 DIAGNOSIS — Z20822 Contact with and (suspected) exposure to covid-19: Secondary | ICD-10-CM | POA: Diagnosis present

## 2019-09-20 DIAGNOSIS — J869 Pyothorax without fistula: Secondary | ICD-10-CM | POA: Diagnosis present

## 2019-09-20 DIAGNOSIS — J9811 Atelectasis: Secondary | ICD-10-CM | POA: Diagnosis present

## 2019-09-20 DIAGNOSIS — J9601 Acute respiratory failure with hypoxia: Secondary | ICD-10-CM | POA: Diagnosis not present

## 2019-09-20 DIAGNOSIS — Z87891 Personal history of nicotine dependence: Secondary | ICD-10-CM | POA: Diagnosis not present

## 2019-09-20 DIAGNOSIS — J189 Pneumonia, unspecified organism: Secondary | ICD-10-CM | POA: Diagnosis present

## 2019-09-20 DIAGNOSIS — Z833 Family history of diabetes mellitus: Secondary | ICD-10-CM | POA: Diagnosis not present

## 2019-09-20 DIAGNOSIS — R590 Localized enlarged lymph nodes: Secondary | ICD-10-CM | POA: Diagnosis present

## 2019-09-20 DIAGNOSIS — M899 Disorder of bone, unspecified: Secondary | ICD-10-CM | POA: Diagnosis present

## 2019-09-20 DIAGNOSIS — E1165 Type 2 diabetes mellitus with hyperglycemia: Secondary | ICD-10-CM | POA: Diagnosis present

## 2019-09-20 DIAGNOSIS — Z79899 Other long term (current) drug therapy: Secondary | ICD-10-CM | POA: Diagnosis not present

## 2019-09-20 DIAGNOSIS — Z880 Allergy status to penicillin: Secondary | ICD-10-CM | POA: Diagnosis not present

## 2019-09-20 DIAGNOSIS — C3432 Malignant neoplasm of lower lobe, left bronchus or lung: Secondary | ICD-10-CM | POA: Diagnosis present

## 2019-09-20 DIAGNOSIS — R109 Unspecified abdominal pain: Secondary | ICD-10-CM | POA: Diagnosis not present

## 2019-09-20 DIAGNOSIS — Z801 Family history of malignant neoplasm of trachea, bronchus and lung: Secondary | ICD-10-CM | POA: Diagnosis not present

## 2019-09-20 DIAGNOSIS — J90 Pleural effusion, not elsewhere classified: Secondary | ICD-10-CM | POA: Diagnosis present

## 2019-09-20 DIAGNOSIS — R918 Other nonspecific abnormal finding of lung field: Secondary | ICD-10-CM | POA: Diagnosis present

## 2019-09-20 LAB — PROCALCITONIN: Procalcitonin: 4.92 ng/mL

## 2019-09-20 LAB — EXPECTORATED SPUTUM ASSESSMENT W GRAM STAIN, RFLX TO RESP C

## 2019-09-20 LAB — CBC
HCT: 42.5 % (ref 39.0–52.0)
Hemoglobin: 14.1 g/dL (ref 13.0–17.0)
MCH: 29.9 pg (ref 26.0–34.0)
MCHC: 33.2 g/dL (ref 30.0–36.0)
MCV: 90.2 fL (ref 80.0–100.0)
Platelets: 292 10*3/uL (ref 150–400)
RBC: 4.71 MIL/uL (ref 4.22–5.81)
RDW: 12.1 % (ref 11.5–15.5)
WBC: 24.2 10*3/uL — ABNORMAL HIGH (ref 4.0–10.5)
nRBC: 0 % (ref 0.0–0.2)

## 2019-09-20 LAB — BASIC METABOLIC PANEL
Anion gap: 11 (ref 5–15)
BUN: 19 mg/dL (ref 8–23)
CO2: 26 mmol/L (ref 22–32)
Calcium: 8.8 mg/dL — ABNORMAL LOW (ref 8.9–10.3)
Chloride: 98 mmol/L (ref 98–111)
Creatinine, Ser: 1.13 mg/dL (ref 0.61–1.24)
GFR calc Af Amer: >60 mL/min (ref >60)
GFR calc non Af Amer: >60 mL/min (ref >60)
Glucose, Bld: 165 mg/dL — ABNORMAL HIGH (ref 70–99)
Potassium: 4 mmol/L (ref 3.5–5.1)
Sodium: 135 mmol/L (ref 135–145)

## 2019-09-20 LAB — STREP PNEUMONIAE URINARY ANTIGEN: Strep Pneumo Urinary Antigen: NEGATIVE

## 2019-09-20 LAB — LACTIC ACID, PLASMA
Lactic Acid, Venous: 2.3 mmol/L (ref 0.5–1.9)
Lactic Acid, Venous: 1.8 mmol/L (ref 0.5–1.9)

## 2019-09-20 LAB — HIV ANTIBODY (ROUTINE TESTING W REFLEX): HIV Screen 4th Generation wRfx: NONREACTIVE

## 2019-09-20 MED ORDER — MORPHINE SULFATE (PF) 2 MG/ML IV SOLN
2.0000 mg | INTRAVENOUS | Status: DC | PRN
  Administered 2019-09-22 – 2019-09-23 (×2): 2 mg via INTRAVENOUS
  Filled 2019-09-20 (×2): qty 1

## 2019-09-20 MED ORDER — VANCOMYCIN HCL 750 MG/150ML IV SOLN
750.0000 mg | Freq: Two times a day (BID) | INTRAVENOUS | Status: DC
  Administered 2019-09-21 – 2019-09-23 (×5): 750 mg via INTRAVENOUS
  Filled 2019-09-20 (×6): qty 150

## 2019-09-20 MED ORDER — ACETAMINOPHEN 325 MG PO TABS
650.0000 mg | ORAL_TABLET | Freq: Four times a day (QID) | ORAL | Status: DC | PRN
  Administered 2019-09-20 – 2019-09-26 (×14): 650 mg via ORAL
  Filled 2019-09-20 (×14): qty 2

## 2019-09-20 MED ORDER — PIPERACILLIN-TAZOBACTAM 3.375 G IVPB
3.3750 g | Freq: Three times a day (TID) | INTRAVENOUS | Status: AC
  Administered 2019-09-20 – 2019-09-26 (×19): 3.375 g via INTRAVENOUS
  Filled 2019-09-20 (×19): qty 50

## 2019-09-20 MED ORDER — GUAIFENESIN-CODEINE 100-10 MG/5ML PO SOLN
5.0000 mL | ORAL | Status: DC | PRN
  Administered 2019-09-20 – 2019-09-21 (×4): 5 mL via ORAL
  Filled 2019-09-20 (×4): qty 5

## 2019-09-20 MED ORDER — AZITHROMYCIN 500 MG IV SOLR: 500.0000 mg | INTRAVENOUS | Status: DC

## 2019-09-20 MED ORDER — GABAPENTIN 600 MG PO TABS
300.0000 mg | ORAL_TABLET | Freq: Three times a day (TID) | ORAL | Status: DC
  Administered 2019-09-20 – 2019-09-27 (×22): 300 mg via ORAL
  Filled 2019-09-20 (×22): qty 1

## 2019-09-20 MED ORDER — ACETAMINOPHEN 10 MG/ML IV SOLN
1000.0000 mg | Freq: Once | INTRAVENOUS | Status: AC
  Administered 2019-09-20: 1000 mg via INTRAVENOUS
  Filled 2019-09-20: qty 100

## 2019-09-20 MED ORDER — VANCOMYCIN HCL 1500 MG/300ML IV SOLN
1500.0000 mg | Freq: Once | INTRAVENOUS | Status: AC
  Administered 2019-09-20: 1500 mg via INTRAVENOUS
  Filled 2019-09-20: qty 300

## 2019-09-20 MED ORDER — LACTATED RINGERS IV BOLUS (SEPSIS)
1000.0000 mL | Freq: Once | INTRAVENOUS | Status: AC
  Administered 2019-09-20: 1000 mL via INTRAVENOUS

## 2019-09-20 MED ORDER — KETOROLAC TROMETHAMINE 15 MG/ML IJ SOLN
15.0000 mg | Freq: Four times a day (QID) | INTRAMUSCULAR | Status: AC
  Administered 2019-09-20 – 2019-09-25 (×18): 15 mg via INTRAVENOUS
  Filled 2019-09-20 (×18): qty 1

## 2019-09-20 MED ORDER — LACTATED RINGERS IV BOLUS
2000.0000 mL | Freq: Once | INTRAVENOUS | Status: AC
  Administered 2019-09-20: 2000 mL via INTRAVENOUS

## 2019-09-20 MED ORDER — LACTATED RINGERS IV SOLN: INTRAVENOUS | Status: DC

## 2019-09-20 MED ORDER — SODIUM CHLORIDE 0.9 % IV SOLN
INTRAVENOUS | Status: DC | PRN
  Administered 2019-09-20 – 2019-09-23 (×4): 250 mL via INTRAVENOUS

## 2019-09-20 MED ORDER — BENZONATATE 100 MG PO CAPS
200.0000 mg | ORAL_CAPSULE | Freq: Three times a day (TID) | ORAL | Status: DC
  Administered 2019-09-20 – 2019-09-27 (×22): 200 mg via ORAL
  Filled 2019-09-20 (×22): qty 2

## 2019-09-20 MED ORDER — DIPHENHYDRAMINE HCL 25 MG PO CAPS: 25.0000 mg | ORAL_CAPSULE | Freq: Four times a day (QID) | ORAL | Status: DC | PRN

## 2019-09-20 NOTE — Progress Notes (Signed)
PROGRESS NOTE    Alan Wolf  OVZ:[id number] DOB: 1950/11/07 DOA: 09/19/2019 PCP: Marguerita Merles, MD   Brief Narrative:  HPI: Alan Wolf is a 69 y.o. male without significant past medical history, who presents with left flank pain.  Patient states that he has been having left flank pain for almost 1 month.  The pain is located in the left flank area, constant, sharp, moderate to severe, nonradiating.  It is aggravated by movement or deep breath.  Patient has mild cough and mild shortness of breath.  Occasionally he coughs up streaks of pink-colored mucus.  Denies fever or chills.He was initially seen and thought to have pneumonia on chest x-ray. He got 2 different antibiotics but is not any better.  Patient does not have nausea, vomiting, diarrhea, abdominal pain, symptoms of UTI or unilateral weakness.   Pt was seen in ED 09/12/19, and had CT-renal stone protocol which showed small LEFT pleural effusion and a small nonspecific lucent focus within the posterior aspect of the L4 vertebral body. Pt states that he has appointment with pulmonologist next Monday.  7/30: Patient seen and examined. Patient's daughter is at bedside. Long discussion regarding lung findings, preceding symptoms, ongoing work-up. Patient himself complains of left side and flank pain especially worse when he coughs.   Assessment & Plan:   Principal Problem:   Mass of lower lobe of left lung Active Problems:   Leukocytosis   Left flank pain  Lung mass Possible community acquired pneumonia Sepsis secondary to above   CTA showed 5.7 cm left lower lobe mass with bronchial adenopathy.  CT also showed small left pleural effusion complicated by areas of loculation.  Etiology is not clear.  Pt had CT-renal stone protocol on 7/22, which showed small LEFT pleural effusion and small nonspecific lucent focus within the posterior aspect of the L4 vertebral body.  Differential diagnosis include malignancy, atypical  infection.   Oncology, Dr. Rogue Bussing is consulted.   Also consulted pulmonologist, Dr. Lanney Gins  -->Dr. Raul Del will see pt 09/20/19 -Patient had some fevers over interval and elevated procalcitonin, raising the possibility of atypical infection. Unable to exclude malignancy at this time. Patient also has elevated heart rates above 90, is provides a SIRS criteria and cough occasion for sepsis Plan: Admit inpatient DC Rocephin Start Zosyn for anaerobic and atypical coverage Continue azithromycin Follow blood urine and sputum cultures As needed bronchodilators Cough suppression, Tessalon Perles and codeine/guaifenesin Check strep urinary antigen Check Legionella urinary antigen Check Fungitell Follow any further pulmonary recommendations  Leukocytosis:  WBC 11.7-->24 -on antibiotics as above  Left flank pain:  Due to left lung mass -As needed Percocet and morphine for pain -Add scheduled Toradol -Add scheduled gabapentin   DVT prophylaxis: Lovenox Code Status: Full Family Communication: Daughter at bedside Disposition Plan: Status is: Inpatient  Remains inpatient appropriate because:Inpatient level of care appropriate due to severity of illness   Dispo: The patient is from: Home              Anticipated d/c is to: Home              Anticipated d/c date is: 3 days              Patient currently is not medically stable to d/c.  Patient now with clinical sepsis secondary to atypical pneumonia. Also at this time unable to exclude lung base malignancy.  Consultants:   Oncology  Pulmonary  Procedures:   None  Antimicrobials:   Rocephin, discontinued  09/20/2019  Zosyn, started 09/20/2019  Azithromycin, started 09/19/2019   Subjective: Patient seen and examined. Complains of cough and left-sided chest pain  Objective: Vitals:   09/20/19 0527 09/20/19 0559 09/20/19 0800 09/20/19 1308  BP:  126/79 (!) 156/71 (!) 129/66  Pulse:  92 93 98  Resp:  20 (!) 24  16  Temp: 99.8 F (37.7 C) 100.2 F (37.9 C)  (!) 101.5 F (38.6 C)  TempSrc: Oral Oral  Oral  SpO2:  94% 94% 94%  Weight:      Height:        Intake/Output Summary (Last 24 hours) at 09/20/2019 1435 Last data filed at 09/20/2019 1300 Gross per 24 hour  Intake 616.49 ml  Output 650 ml  Net -33.51 ml   Filed Weights   09/19/19 0458 09/19/19 2152  Weight: 70.3 kg 68.6 kg    Examination:  General exam: Appears calm and comfortable  Respiratory system: Crackles at the left base, equal air entry, normal work of breathing Cardiovascular system: Tachycardic, regular rhythm, no murmurs Gastrointestinal system: Abdomen is nondistended, soft and nontender. No organomegaly or masses felt. Normal bowel sounds heard. Central nervous system: Alert and oriented. No focal neurological deficits. Extremities: Symmetric 5 x 5 power. Skin: No rashes, lesions or ulcers Psychiatry: Judgement and insight appear normal. Mood & affect appropriate.     Data Reviewed: I have personally reviewed following labs and imaging studies  CBC: Recent Labs  Lab 09/19/19 0518 09/20/19 0452  WBC 11.7* 24.2*  NEUTROABS 8.6*  --   HGB 14.5 14.1  HCT 42.3 42.5  MCV 89.8 90.2  PLT 332 378   Basic Metabolic Panel: Recent Labs  Lab 09/19/19 0518 09/20/19 0452  NA 139 135  K 3.6 4.0  CL 99 98  CO2 28 26  GLUCOSE 217* 165*  BUN 21 19  CREATININE 1.26* 1.13  CALCIUM 8.9 8.8*   GFR: Estimated Creatinine Clearance: 61.6 mL/min (by C-G formula based on SCr of 1.13 mg/dL). Liver Function Tests: Recent Labs  Lab 09/19/19 0518  AST 22  ALT 28  ALKPHOS 110  BILITOT 1.0  PROT 8.2*  ALBUMIN 3.7   No results for input(s): LIPASE, AMYLASE in the last 168 hours. No results for input(s): AMMONIA in the last 168 hours. Coagulation Profile: Recent Labs  Lab 09/19/19 1224  INR 1.1   Cardiac Enzymes: No results for input(s): CKTOTAL, CKMB, CKMBINDEX, TROPONINI in the last 168 hours. BNP (last 3  results) No results for input(s): PROBNP in the last 8760 hours. HbA1C: No results for input(s): HGBA1C in the last 72 hours. CBG: Recent Labs  Lab 09/19/19 0927  GLUCAP 119*   Lipid Profile: No results for input(s): CHOL, HDL, LDLCALC, TRIG, CHOLHDL, LDLDIRECT in the last 72 hours. Thyroid Function Tests: No results for input(s): TSH, T4TOTAL, FREET4, T3FREE, THYROIDAB in the last 72 hours. Anemia Panel: No results for input(s): VITAMINB12, FOLATE, FERRITIN, TIBC, IRON, RETICCTPCT in the last 72 hours. Sepsis Labs: Recent Labs  Lab 09/20/19 0452  PROCALCITON 4.92    Recent Results (from the past 240 hour(s))  SARS Coronavirus 2 by RT PCR (hospital order, performed in [facility] hospital lab) Nasopharyngeal Nasopharyngeal Swab     Status: None   Collection Time: 09/19/19  8:40 AM   Specimen: Nasopharyngeal Swab  Result Value Ref Range Status   SARS Coronavirus 2 NEGATIVE NEGATIVE Final    Comment: (NOTE) SARS-CoV-2 target nucleic acids are NOT DETECTED.  The SARS-CoV-2 RNA is generally detectable  in upper and lower respiratory specimens during the acute phase of infection. The lowest concentration of SARS-CoV-2 viral copies this assay can detect is 250 copies / mL. A negative result does not preclude SARS-CoV-2 infection and should not be used as the sole basis for treatment or other patient management decisions.  A negative result may occur with improper specimen collection / handling, submission of specimen other than nasopharyngeal swab, presence of viral mutation(s) within the areas targeted by this assay, and inadequate number of viral copies (<250 copies / mL). A negative result must be combined with clinical observations, patient history, and epidemiological information.  Fact Sheet for Patients:   StrictlyIdeas.no  Fact Sheet for Healthcare Providers: BankingDealers.co.za  This test is not yet approved or   cleared by the [place] FDA and has been authorized for detection and/or diagnosis of SARS-CoV-2 by FDA under an Emergency Use Authorization (EUA).  This EUA will remain in effect (meaning this test can be used) for the duration of the COVID-19 declaration under Section 564(b)(1) of the Act, 21 U.S.C. section 360bbb-3(b)(1), unless the authorization is terminated or revoked sooner.  Performed at [facility], [address] [place], [place] [address]   CULTURE, BLOOD (ROUTINE X 2) w Reflex to ID Panel     Status: None (Preliminary result)   Collection Time: 09/19/19  6:34 PM   Specimen: BLOOD  Result Value Ref Range Status   Specimen Description BLOOD LEFT ANTECUBITAL  Final   Special Requests   Final    BOTTLES DRAWN AEROBIC AND ANAEROBIC Blood Culture adequate volume   Culture   Final    NO GROWTH < 24 HOURS Performed at [facility], [address] [place], [place] [address]    Report Status PENDING  Incomplete  CULTURE, BLOOD (ROUTINE X 2) w Reflex to ID Panel     Status: None (Preliminary result)   Collection Time: 09/19/19  6:34 PM   Specimen: BLOOD  Result Value Ref Range Status   Specimen Description BLOOD BLOOD LEFT HAND  Final   Special Requests   Final    BOTTLES DRAWN AEROBIC AND ANAEROBIC Blood Culture adequate volume   Culture   Final    NO GROWTH < 12 HOURS Performed at [facility], [address], [place], [place] [address]    Report Status PENDING  Incomplete  Expectorated sputum assessment w rflx to resp cult     Status: None   Collection Time: 09/20/19  5:23 AM   Specimen: Sputum  Result Value Ref Range Status   Specimen Description SPUTUM  Final   Special Requests NONE  Final   Sputum evaluation   Final    THIS SPECIMEN IS ACCEPTABLE FOR SPUTUM CULTURE Performed at [facility], [address] [place],  [address]    Report Status 09/20/2019 FINAL  Final  Culture, respiratory     Status: None  (Preliminary result)   Collection Time: 09/20/19  5:23 AM   Specimen: SPU  Result Value Ref Range Status   Specimen Description   Final    SPUTUM Performed at [facility], [address], [place],  [address]    Special Requests   Final    NONE Reflexed from [id number] Performed at [facility], [address] [place],  [address]    Gram Stain   Final    MODERATE WBC PRESENT,BOTH PMN AND MONONUCLEAR RARE SQUAMOUS EPITHELIAL CELLS PRESENT RARE GRAM POSITIVE COCCI IN PAIRS RARE  GRAM POSITIVE RODS Performed at [facility], [place] [address] [place], [place] [address]    Culture PENDING  Incomplete   Report Status PENDING  Incomplete         Radiology Studies: CT Angio Chest PE W and/or Wo Contrast  Result Date: 09/19/2019 CLINICAL DATA:  Chest pain with pleurisy or effusion suspected EXAM: CT ANGIOGRAPHY CHEST WITH CONTRAST TECHNIQUE: Multidetector CT imaging of the chest was performed using the standard protocol during bolus administration of intravenous contrast. Multiplanar CT image reconstructions and MIPs were obtained to evaluate the vascular anatomy. CONTRAST:  69mL OMNIPAQUE IOHEXOL 350 MG/ML SOLN COMPARISON:  Abdominal CT 09/12/2019 FINDINGS: Cardiovascular: Normal heart size. No pericardial effusion. Atheromatous calcification along the aorta and left main coronary. Mediastinum/Nodes: Mildly enlarged bronchial lymph nodes the left lower lobe, 11 mm in diameter. Lungs/Pleura: Ovoid area of decreased enhancement and vessel attenuation measuring up to 5.7 cm on axial slices. There is adjacent enhancing compressed appearing lung. Small left pleural effusion with some scalloping. Upper Abdomen: Negative Musculoskeletal: Negative for bone erosion. No evidence of osseous metastatic disease. Review of the MIP images confirms the above findings. IMPRESSION: 1. Concern for left lower lobe mass with bronchial adenopathy. Recommend referral to multi  disciplinary thoracic oncology. Findings are not definitive for malignancy and could be related to round atelectasis or atypical pneumonia, consider antibiotic therapy in the meantime. 2. Small left pleural effusion complicated by areas of loculation. 3. Negative for pulmonary embolism. Electronically Signed   By: Monte Fantasia M.D.   On: 09/19/2019 06:44   US Venous Img Lower Bilateral (DVT)  Result Date: 09/19/2019 CLINICAL DATA:  Elevated D-dimer.  Evaluate for DVT. EXAM: BILATERAL LOWER EXTREMITY VENOUS DOPPLER ULTRASOUND TECHNIQUE: Gray-scale sonography with graded compression, as well as color Doppler and duplex ultrasound were performed to evaluate the lower extremity deep venous systems from the level of the common femoral vein and including the common femoral, femoral, profunda femoral, popliteal and calf veins including the posterior tibial, peroneal and gastrocnemius veins when visible. The superficial great saphenous vein was also interrogated. Spectral Doppler was utilized to evaluate flow at rest and with distal augmentation maneuvers in the common femoral, femoral and popliteal veins. COMPARISON:  None. FINDINGS: RIGHT LOWER EXTREMITY Common Femoral Vein: No evidence of thrombus. Normal compressibility, respiratory phasicity and response to augmentation. Saphenofemoral Junction: No evidence of thrombus. Normal compressibility and flow on color Doppler imaging. Profunda Femoral Vein: No evidence of thrombus. Normal compressibility and flow on color Doppler imaging. Femoral Vein: No evidence of thrombus. Normal compressibility, respiratory phasicity and response to augmentation. Popliteal Vein: No evidence of thrombus. Normal compressibility, respiratory phasicity and response to augmentation. Calf Veins: No evidence of thrombus. Normal compressibility and flow on color Doppler imaging. Superficial Great Saphenous Vein: No evidence of thrombus. Normal compressibility. Venous Reflux:  None. Other  Findings:  None. LEFT LOWER EXTREMITY Common Femoral Vein: No evidence of thrombus. Normal compressibility, respiratory phasicity and response to augmentation. Saphenofemoral Junction: No evidence of thrombus. Normal compressibility and flow on color Doppler imaging. Profunda Femoral Vein: No evidence of thrombus. Normal compressibility and flow on color Doppler imaging. Femoral Vein: No evidence of thrombus. Normal compressibility, respiratory phasicity and response to augmentation. Popliteal Vein: No evidence of thrombus. Normal compressibility, respiratory phasicity and response to augmentation. Calf Veins: No evidence of thrombus. Normal compressibility and flow on color Doppler imaging. Superficial Great Saphenous Vein: No evidence of thrombus. Normal compressibility. Venous Reflux:  None. Other Findings:  None. IMPRESSION:  No evidence of DVT within either lower extremity. Electronically Signed   By: Sandi Mariscal M.D.   On: 09/19/2019 12:29        Scheduled Meds: . benzonatate  200 mg Oral TID  . gabapentin  300 mg Oral TID  . ketorolac  15 mg Intravenous Q6H   Continuous Infusions: . azithromycin Stopped (09/19/19 2014)  . piperacillin-tazobactam (ZOSYN)  IV       LOS: 0 days    Time spent: 25 minutes    Sidney Ace, MD [facility] Pager [phone]  If 7PM-7AM, please contact night-coverage 09/20/2019, 2:35 PM

## 2019-09-20 NOTE — Consult Note (Signed)
Pharmacy Antibiotic Note  Alan Wolf is a 69 y.o. male admitted on 09/19/2019 with aspiration pneumonia.  Pharmacy has been consulted for Zosyn dosing.    Clarified PCN allergy with patient and updated allergy documentation.    Based on history given by patient and discussion with Dr [name], will proceed with Zosyn and monitor carefully.  Plan: Zosyn 3.375g IV q8h (4 hour infusion).  (will continue azithromycin)  Height: 5\' 11"  (180.3 cm) Weight: 68.6 kg (151 lb 3.8 oz) IBW/kg (Calculated) : 75.3  Temp (24hrs), Avg:100 F (37.8 C), Min:98.4 F (36.9 C), Max:101.5 F (38.6 C)  Recent Labs  Lab 09/19/19 0518 09/20/19 0452  WBC 11.7* 24.2*  CREATININE 1.26* 1.13    Estimated Creatinine Clearance: 61.6 mL/min (by C-G formula based on SCr of 1.13 mg/dL).    Allergies  Allergen Reactions  . Penicillins     Per pt - when a child, it made the fever go up instead of down    Antimicrobials this admission: Ceftriaxone 7/29 >> 7/29 Azithromycin 7/29 >> Zosyn 7/30 >>  Dose adjustments this admission: none  Microbiology results: 7/29 BCx: pending  7/30 Sputum: GPC, GPR  7/30 MRSA PCR: pending COVID NEG  Thank you for allowing pharmacy to be a part of this patient's care.  Lu Duffel, PharmD, BCPS Clinical Pharmacist 09/20/2019 2:34 PM

## 2019-09-20 NOTE — Significant Event (Signed)
RRT called for patient's high temperature of 103.2 and lactic acid of 2.3. Possible Sepsis/SIRs. Dr. [name] paged.   Fuller Mandril, RN

## 2019-09-20 NOTE — Care Management (Signed)
MD called to bedside for high fever.  On arrival patient hemodynamically stable.  Adequate blood pressure.  Heart rate within normal limits.  Patient awake and talking.  Will escalate sepsis bundle.  3 L lactated Ringer bolus followed by maintenance fluids.  Expand antibiotic coverage to Vanco, Zosyn, azithromycin.  Follow blood urine and sputum cultures.  Appreciate pulmonary evaluation.  [name] MD

## 2019-09-20 NOTE — Progress Notes (Signed)
Pharmacy Antibiotic Note  Ricard Faulkner is a 69 y.o. male admitted on 09/19/2019 with sepsis.  Pharmacy has been consulted for Vancomycin  dosing.  Plan: Vancomycin 1500 mg IV X 1 to be given on 7/30 @ ~ 1700  Vancomycin 750 mg IV Q12H to start on 7/31 @ ~ 0500. No trough currently ordered.   Height: 5\' 11"  (180.3 cm) Weight: 68.6 kg (151 lb 3.8 oz) IBW/kg (Calculated) : 75.3  Temp (24hrs), Avg:100.4 F (38 C), Min:98.4 F (36.9 C), Max:103.2 F (39.6 C)  Recent Labs  Lab 09/19/19 0518 09/20/19 0452 09/20/19 1455  WBC 11.7* 24.2*  --   CREATININE 1.26* 1.13  --   LATICACIDVEN  --   --  2.3*    Estimated Creatinine Clearance: 61.6 mL/min (by C-G formula based on SCr of 1.13 mg/dL).    Allergies  Allergen Reactions  . Penicillins     Per pt - when a child, it made the fever go up instead of down    Antimicrobials this admission:   >>    >>   Dose adjustments this admission:   Microbiology results:  BCx:   UCx:    Sputum:    MRSA PCR:   Thank you for allowing pharmacy to be a part of this patient's care.  Westen Dinino D 09/20/2019 4:04 PM

## 2019-09-20 NOTE — Consult Note (Signed)
Pulmonary Medicine          Date: 09/20/2019,   MRN# [id number] Alan Wolf 30-Oct-1950     AdmissionWeight: 70.3 kg                 CurrentWeight: 68.6 kg  Referring physician: Dr Blaine Hamper    CHIEF COMPLAINT:   Lung Mass of left lowe lobe with hilar lymphadenopathy.    HISTORY OF PRESENT ILLNESS   69yo M with essentially no PMH has had recurrent cough chronically with intermittent episodes of non-massive hemoptysis and is s/p failure of outpatient therapy for bronchitis post z pack trial 08/2019 who complains of progressively worsening left flank pain. In the ER serology was negative for COVID, cbc unimpressive, patient non toxic appearing vitals stable on ra. He had CT chest done showing LLL 5.7cm mass. He was seen by oncology and was also noted to have lytic lesion of L4 and has follow up testing scheduled for myeloma. Patient is walking around room during my evaluation and daughter is at bedside.    PAST MEDICAL HISTORY   History reviewed. No pertinent past medical history.   SURGICAL HISTORY   History reviewed. No pertinent surgical history.   FAMILY HISTORY   Family History  Problem Relation Age of Onset  . Diabetes Mellitus II Mother   . Lung cancer Brother      SOCIAL HISTORY   Social History   Tobacco Use  . Smoking status: Never Smoker  . Smokeless tobacco: Never Used  Substance Use Topics  . Alcohol use: Never  . Drug use: Never     MEDICATIONS    Home Medication:    Current Medication:  Current Facility-Administered Medications:  .  acetaminophen (OFIRMEV) IV 1,000 mg, 1,000 mg, Intravenous, Once, Sreenath, Sudheer B, MD, Last Rate: 400 mL/hr at 09/20/19 1559, 1,000 mg at 09/20/19 1559 .  acetaminophen (TYLENOL) tablet 650 mg, 650 mg, Oral, Q6H PRN, [name], Sudheer B, MD, 650 mg at 09/20/19 1322 .  albuterol (PROVENTIL) (2.5 MG/3ML) 0.083% nebulizer solution 2.5 mg, 2.5 mg, Nebulization, Q4H PRN, [name], MD .  azithromycin  (ZITHROMAX) 500 mg in sodium chloride 0.9 % 250 mL IVPB, 500 mg, Intravenous, Q24H, [name], MD, Stopped at 09/19/19 2014 .  benzonatate (TESSALON) capsule 200 mg, 200 mg, Oral, TID, Sreenath, Sudheer B, MD, 200 mg at 09/20/19 1549 .  diphenhydrAMINE (BENADRYL) capsule 25 mg, 25 mg, Oral, Q6H PRN, Sreenath, Sudheer B, MD .  gabapentin (NEURONTIN) tablet 300 mg, 300 mg, Oral, TID, Sreenath, Sudheer B, MD, 300 mg at 09/20/19 1549 .  guaiFENesin-codeine 100-10 MG/5ML solution 5 mL, 5 mL, Oral, Q4H PRN, [name], Sudheer B, MD, 5 mL at 09/20/19 1124 .  ketorolac (TORADOL) 15 MG/ML injection 15 mg, 15 mg, Intravenous, Q6H, Sreenath, Sudheer B, MD, 15 mg at 09/20/19 1125 .  lactated ringers bolus 1,000 mL, 1,000 mL, Intravenous, Once, Sreenath, Sudheer B, MD, Last Rate: 999 mL/hr at 09/20/19 1533, 1,000 mL at 09/20/19 1533 .  lactated ringers infusion, , Intravenous, Continuous, Sreenath, Sudheer B, MD .  morphine 2 MG/ML injection 2 mg, 2 mg, Intravenous, Q3H PRN, [name], Sudheer B, MD .  ondansetron (ZOFRAN) injection 4 mg, 4 mg, Intravenous, Q8H PRN, [name], MD .  oxyCODONE-acetaminophen (PERCOCET/ROXICET) 5-325 MG per tablet 1 tablet, 1 tablet, Oral, Q4H PRN, [name], MD, 1 tablet at 09/19/19 1625 .  piperacillin-tazobactam (ZOSYN) IVPB 3.375 g, 3.375 g, Intravenous, Q8H, Shanlever, Pierce Crane, RPH .  vancomycin (  VANCOREADY) IVPB 1500 mg/300 mL, 1,500 mg, Intravenous, Once, Sreenath, Sudheer B, MD .  Derrill Memo ON 09/21/2019] vancomycin (VANCOREADY) IVPB 750 mg/150 mL, 750 mg, Intravenous, Q12H, Sreenath, Sudheer B, MD    ALLERGIES   Penicillins     REVIEW OF SYSTEMS    Review of Systems:  Gen:  Denies  fever, sweats, chills weigh loss  HEENT: Denies blurred vision, double vision, ear pain, eye pain, hearing loss, nose bleeds, sore throat Cardiac:  No dizziness, chest pain or heaviness, chest tightness,edema Resp:   Denies cough or sputum porduction, shortness of breath,wheezing,  hemoptysis,  Gi: Denies swallowing difficulty, stomach pain, nausea or vomiting, diarrhea, constipation, bowel incontinence Gu:  Denies bladder incontinence, burning urine Ext:   Denies Joint pain, stiffness or swelling Skin: Denies  skin rash, easy bruising or bleeding or hives Endoc:  Denies polyuria, polydipsia , polyphagia or weight change Psych:   Denies depression, insomnia or hallucinations   Other:  All other systems negative   VS: BP 121/67 (BP Location: Left Arm)   Pulse 99   Temp (!) 103.2 F (39.6 C) (Oral)   Resp (!) 30   Ht 5\' 11"  (1.803 m)   Wt 68.6 kg   SpO2 91%   BMI 21.09 kg/m      PHYSICAL EXAM    GENERAL:NAD, no fevers, chills, no weakness no fatigue HEAD: Normocephalic, atraumatic.  EYES: Pupils equal, round, reactive to light. Extraocular muscles intact. No scleral icterus.  MOUTH: Moist mucosal membrane. Dentition intact. No abscess noted.  EAR, NOSE, THROAT: Clear without exudates. No external lesions.  NECK: Supple. No thyromegaly. No nodules. No JVD.  PULMONARY: Diffuse coarse rhonchi right sided +wheezes CARDIOVASCULAR: S1 and S2. Regular rate and rhythm. No murmurs, rubs, or gallops. No edema. Pedal pulses 2+ bilaterally.  GASTROINTESTINAL: Soft, nontender, nondistended. No masses. Positive bowel sounds. No hepatosplenomegaly.  MUSCULOSKELETAL: No swelling, clubbing, or edema. Range of motion full in all extremities.  NEUROLOGIC: Cranial nerves II through XII are intact. No gross focal neurological deficits. Sensation intact. Reflexes intact.  SKIN: No ulceration, lesions, rashes, or cyanosis. Skin warm and dry. Turgor intact.  PSYCHIATRIC: Mood, affect within normal limits. The patient is awake, alert and oriented x 3. Insight, judgment intact.       IMAGING    CT Angio Chest PE W and/or Wo Contrast  Result Date: 09/19/2019 CLINICAL DATA:  Chest pain with pleurisy or effusion suspected EXAM: CT ANGIOGRAPHY CHEST WITH CONTRAST TECHNIQUE:  Multidetector CT imaging of the chest was performed using the standard protocol during bolus administration of intravenous contrast. Multiplanar CT image reconstructions and MIPs were obtained to evaluate the vascular anatomy. CONTRAST:  66mL OMNIPAQUE IOHEXOL 350 MG/ML SOLN COMPARISON:  Abdominal CT 09/12/2019 FINDINGS: Cardiovascular: Normal heart size. No pericardial effusion. Atheromatous calcification along the aorta and left main coronary. Mediastinum/Nodes: Mildly enlarged bronchial lymph nodes the left lower lobe, 11 mm in diameter. Lungs/Pleura: Ovoid area of decreased enhancement and vessel attenuation measuring up to 5.7 cm on axial slices. There is adjacent enhancing compressed appearing lung. Small left pleural effusion with some scalloping. Upper Abdomen: Negative Musculoskeletal: Negative for bone erosion. No evidence of osseous metastatic disease. Review of the MIP images confirms the above findings. IMPRESSION: 1. Concern for left lower lobe mass with bronchial adenopathy. Recommend referral to multi disciplinary thoracic oncology. Findings are not definitive for malignancy and could be related to round atelectasis or atypical pneumonia, consider antibiotic therapy in the meantime. 2. Small left pleural effusion complicated  by areas of loculation. 3. Negative for pulmonary embolism. Electronically Signed   By: Monte Fantasia M.D.   On: 09/19/2019 06:44   US Venous Img Lower Bilateral (DVT)  Result Date: 09/19/2019 CLINICAL DATA:  Elevated D-dimer.  Evaluate for DVT. EXAM: BILATERAL LOWER EXTREMITY VENOUS DOPPLER ULTRASOUND TECHNIQUE: Gray-scale sonography with graded compression, as well as color Doppler and duplex ultrasound were performed to evaluate the lower extremity deep venous systems from the level of the common femoral vein and including the common femoral, femoral, profunda femoral, popliteal and calf veins including the posterior tibial, peroneal and gastrocnemius veins when visible.  The superficial great saphenous vein was also interrogated. Spectral Doppler was utilized to evaluate flow at rest and with distal augmentation maneuvers in the common femoral, femoral and popliteal veins. COMPARISON:  None. FINDINGS: RIGHT LOWER EXTREMITY Common Femoral Vein: No evidence of thrombus. Normal compressibility, respiratory phasicity and response to augmentation. Saphenofemoral Junction: No evidence of thrombus. Normal compressibility and flow on color Doppler imaging. Profunda Femoral Vein: No evidence of thrombus. Normal compressibility and flow on color Doppler imaging. Femoral Vein: No evidence of thrombus. Normal compressibility, respiratory phasicity and response to augmentation. Popliteal Vein: No evidence of thrombus. Normal compressibility, respiratory phasicity and response to augmentation. Calf Veins: No evidence of thrombus. Normal compressibility and flow on color Doppler imaging. Superficial Great Saphenous Vein: No evidence of thrombus. Normal compressibility. Venous Reflux:  None. Other Findings:  None. LEFT LOWER EXTREMITY Common Femoral Vein: No evidence of thrombus. Normal compressibility, respiratory phasicity and response to augmentation. Saphenofemoral Junction: No evidence of thrombus. Normal compressibility and flow on color Doppler imaging. Profunda Femoral Vein: No evidence of thrombus. Normal compressibility and flow on color Doppler imaging. Femoral Vein: No evidence of thrombus. Normal compressibility, respiratory phasicity and response to augmentation. Popliteal Vein: No evidence of thrombus. Normal compressibility, respiratory phasicity and response to augmentation. Calf Veins: No evidence of thrombus. Normal compressibility and flow on color Doppler imaging. Superficial Great Saphenous Vein: No evidence of thrombus. Normal compressibility. Venous Reflux:  None. Other Findings:  None. IMPRESSION: No evidence of DVT within either lower extremity. Electronically Signed   By:  Sandi Mariscal M.D.   On: 09/19/2019 12:29   CT RENAL STONE STUDY  Result Date: 09/12/2019 CLINICAL DATA:  LEFT flank and LEFT lower quadrant pain for 10-12 days, no history of kidney stones or surgery EXAM: CT ABDOMEN AND PELVIS WITHOUT CONTRAST TECHNIQUE: Multidetector CT imaging of the abdomen and pelvis was performed following the standard protocol without IV contrast. Sagittal and coronal MPR images reconstructed from axial data set. No oral contrast. COMPARISON:  None FINDINGS: Lower chest: Small LEFT pleural effusion with partial atelectasis of LEFT lower lobe. Hepatobiliary: Gallbladder and liver normal appearance Pancreas: Normal appearance Spleen: Normal appearance Adrenals/Urinary Tract: Adrenal glands, kidneys, ureters, and bladder normal appearance per Stomach/Bowel: Normal appendix. Minimally prominent stool in rectum. Large and small bowel loops otherwise unremarkable. No definite gastric abnormalities. Vascular/Lymphatic: Atherosclerotic calcifications aorta and iliac arteries without aneurysm. No adenopathy. Reproductive: Prostatic enlargement, gland measuring 4.7 x 4.3 cm image 72. Other: No free air or free fluid. No inflammatory process. Small RIGHT inguinal hernia containing fat. Musculoskeletal: Small lucent focus within posterior aspect of L4 vertebral body RIGHT of midline, nonspecific. No additional osseous abnormalities. IMPRESSION: Small LEFT pleural effusion with partial atelectasis of LEFT lower lobe. Small RIGHT inguinal hernia containing fat. Prostatic enlargement. No acute intra-abdominal or intrapelvic abnormalities. Small nonspecific lucent focus within the posterior aspect of the L4 vertebral  body, nonspecific, cannot exclude myeloma or potentially lytic metastasis; correlation with workup for myeloma recommended. Aortic Atherosclerosis (ICD10-I70.0). Electronically Signed   By: Lavonia Dana M.D.   On: 09/12/2019 11:00      ASSESSMENT/PLAN   Left lung mass with associated  hilar lymphadenopathy   - possible infectious vs malignant in etiology    - agree with empiric tx for CAP   - will ask for diagnostic thoracentesis although likely not enough fluid    - will need interval imaging /PET to re-evaluate    - will discuss and plan for bronchoscopic evaluation     -agree with workup including stre pneumoniae and legionella    - fungitell is in process    -procalcitonin is elevated at 4.92   -patient is on vancomycin/zosyn empirically - Tmax today 09/20/19-103.2        Left lung Atelectasis  -will add recruitment maneuvers via [employer] q8h with albuterol  and mucomyst 8ml bid 20% to regimen    Thank you for allowing me to participate in the care of this patient.    Patient/Family are satisfied with care plan and all questions have been answered.  This document was prepared using Dragon voice recognition software and may include unintentional dictation errors.     Ottie Glazier, M.D.  Division of [place]

## 2019-09-20 NOTE — Progress Notes (Signed)
Ch visited with Pt and Pt's daughter Jenny Reichmann. Pt told Ch that his side was hurting. Pt requested prayer. Ch prayed with them. Jenny Reichmann was tearful after prayer, but told Ch that it will all get better. Ch let them know about chaplain availability anytime.

## 2019-09-20 NOTE — Progress Notes (Signed)
Patient given 3L LR bolus, IV Abx, and IV tylenol for high fever. Patient did respond well. VS and labs rechecked; temperature 98.6, lactic 1.8. Patient reports he is feeling much better and pain is currently 1/10. Patient will continue to receive IV antibiotics and VS checked based on MEWs algorithm. Will continue to monitor.   Fuller Mandril, RN

## 2019-09-21 ENCOUNTER — Inpatient Hospital Stay: Payer: Medicare HMO

## 2019-09-21 LAB — BASIC METABOLIC PANEL
Anion gap: 9 (ref 5–15)
BUN: 18 mg/dL (ref 8–23)
CO2: 25 mmol/L (ref 22–32)
Calcium: 8.3 mg/dL — ABNORMAL LOW (ref 8.9–10.3)
Chloride: 102 mmol/L (ref 98–111)
Creatinine, Ser: 1.24 mg/dL (ref 0.61–1.24)
GFR calc Af Amer: >60 mL/min (ref >60)
GFR calc non Af Amer: 60 mL/min — ABNORMAL LOW (ref >60)
Glucose, Bld: 243 mg/dL — ABNORMAL HIGH (ref 70–99)
Potassium: 4.2 mmol/L (ref 3.5–5.1)
Sodium: 136 mmol/L (ref 135–145)

## 2019-09-21 LAB — CBC WITH DIFFERENTIAL/PLATELET
Abs Immature Granulocytes: 0.43 10*3/uL — ABNORMAL HIGH (ref 0.00–0.07)
Basophils Absolute: 0.1 10*3/uL (ref 0.0–0.1)
Basophils Relative: 0 %
Eosinophils Absolute: 0.1 10*3/uL (ref 0.0–0.5)
Eosinophils Relative: 0 %
HCT: 36.8 % — ABNORMAL LOW (ref 39.0–52.0)
Hemoglobin: 12.9 g/dL — ABNORMAL LOW (ref 13.0–17.0)
Immature Granulocytes: 2 %
Lymphocytes Relative: 4 %
Lymphs Abs: 1.1 10*3/uL (ref 0.7–4.0)
MCH: 30.9 pg (ref 26.0–34.0)
MCHC: 35.1 g/dL (ref 30.0–36.0)
MCV: 88.2 fL (ref 80.0–100.0)
Monocytes Absolute: 1.3 10*3/uL — ABNORMAL HIGH (ref 0.1–1.0)
Monocytes Relative: 5 %
Neutro Abs: 23.9 10*3/uL — ABNORMAL HIGH (ref 1.7–7.7)
Neutrophils Relative %: 89 %
Platelets: 238 10*3/uL (ref 150–400)
RBC: 4.17 MIL/uL — ABNORMAL LOW (ref 4.22–5.81)
RDW: 12.4 % (ref 11.5–15.5)
Smear Review: NORMAL
WBC: 26.9 10*3/uL — ABNORMAL HIGH (ref 4.0–10.5)
nRBC: 0 % (ref 0.0–0.2)

## 2019-09-21 LAB — MAGNESIUM: Magnesium: 1.9 mg/dL (ref 1.7–2.4)

## 2019-09-21 LAB — MRSA PCR SCREENING: MRSA by PCR: NEGATIVE

## 2019-09-21 MED ORDER — ACETYLCYSTEINE 20 % IN SOLN
4.0000 mL | Freq: Two times a day (BID) | RESPIRATORY_TRACT | Status: DC
  Administered 2019-09-21 – 2019-09-25 (×9): 4 mL via RESPIRATORY_TRACT
  Filled 2019-09-21 (×12): qty 4

## 2019-09-21 MED ORDER — FUROSEMIDE 10 MG/ML IJ SOLN
40.0000 mg | Freq: Once | INTRAMUSCULAR | Status: AC
  Administered 2019-09-21: 40 mg via INTRAVENOUS
  Filled 2019-09-21: qty 4

## 2019-09-21 MED ORDER — PROMETHAZINE-CODEINE 6.25-10 MG/5ML PO SYRP
5.0000 mL | ORAL_SOLUTION | Freq: Four times a day (QID) | ORAL | Status: DC | PRN
  Filled 2019-09-21: qty 5

## 2019-09-21 NOTE — Progress Notes (Signed)
PROGRESS NOTE    Alan Wolf  ZLD:[id number] DOB: [birth year] DOA: 09/19/2019 PCP: Marguerita Merles, MD   Brief Narrative:  HPI: Alan Wolf is a 69 y.o. male without significant past medical history, who presents with left flank pain.  Patient states that he has been having left flank pain for almost 1 month.  The pain is located in the left flank area, constant, sharp, moderate to severe, nonradiating.  It is aggravated by movement or deep breath.  Patient has mild cough and mild shortness of breath.  Occasionally he coughs up streaks of pink-colored mucus.  Denies fever or chills.He was initially seen and thought to have pneumonia on chest x-ray. He got 2 different antibiotics but is not any better.  Patient does not have nausea, vomiting, diarrhea, abdominal pain, symptoms of UTI or unilateral weakness.   Pt was seen in ED 09/12/19, and had CT-renal stone protocol which showed small LEFT pleural effusion and a small nonspecific lucent focus within the posterior aspect of the L4 vertebral body. Pt states that he has appointment with pulmonologist next Monday.  7/30: Patient seen and examined. Patient's daughter is at bedside. Long discussion regarding lung findings, preceding symptoms, ongoing work-up. Patient himself complains of left side and flank pain especially worse when he coughs.  7/31: Patient seen and examined.  Overnight patient became more dyspneic requiring 3 L supplemental oxygen.  Stat chest x-ray performed this morning showed significant interval increase in size of left pleural effusion.  Fluids discontinued.  Patient given Lasix 40 mg IV x1.  On my evaluation patient is breathing little better.  He is still dependent on 2 to 3 L nasal cannula.  Fever T-max 100.8 noted this morning.  Remains on broad-spectrum IV antibiotics.  All cultures no growth to date   Assessment & Plan:   Principal Problem:   Mass of lower lobe of left lung Active Problems:   Leukocytosis    Left flank pain  Lung mass Possible community acquired pneumonia Sepsis secondary to above   CTA showed 5.7 cm left lower lobe mass with bronchial adenopathy.  CT also showed small left pleural effusion complicated by areas of loculation.  Etiology is not clear.  Pt had CT-renal stone protocol on 7/22, which showed small LEFT pleural effusion and small nonspecific lucent focus within the posterior aspect of the L4 vertebral body.  Differential diagnosis include malignancy, atypical infection.   Oncology, Dr. Rogue Bussing is consulted.   Also consulted pulmonologist, Dr. Lanney Gins -Patient had some fevers over interval and elevated procalcitonin, raising the possibility of atypical infection. Unable to exclude malignancy at this time. Patient also has elevated heart rates above 90, is provides a SIRS criteria and cough occasion for sepsis Plan: Continue broad-spectrum antibiotics, vancomycin, Zosyn, azithromycin Follow blood urine and sputum cultures As needed bronchodilators MetaNeb every 8 hours with albuterol added Cough suppression, Tessalon Perles and promethazine/codeine Check strep urinary antigen, pending Check Legionella urinary antigen, pending Check Fungitell, pending  Acute hypoxic respiratory failure Patient received large fluid boluses given concerns for worsening sepsis He then became hypoxic requiring initiation of nasal cannula Chest x-ray this morning demonstrates marked increase in size of left pleural effusion Plan: Hold IV fluids Diuresis, IV Lasix 40 mg x 1, can repeat dose if needed Left thoracentesis ordered.  Fluid studies including cytology and flow cytometry ordered MetaNeb every 8 hour for atelectasis and alveolar recruitment Appreciate any further pulmonary recommendations  Leukocytosis:  WBC 11.7-->24-->26 -on antibiotics as above  Left flank  pain:  Due to left lung mass -As needed Percocet and morphine for pain -Add scheduled Toradol -Add  scheduled gabapentin   DVT prophylaxis: Lovenox Code Status: Full Family Communication: Daughter at bedside 7/30 Disposition Plan: Status is: Inpatient  Remains inpatient appropriate because:Inpatient level of care appropriate due to severity of illness   Dispo: The patient is from: Home              Anticipated d/c is to: Home              Anticipated d/c date is: 3 days              Patient currently is not medically stable to d/c.  Patient now with clinical sepsis secondary to atypical pneumonia. Also at this time unable to exclude lung base malignancy.  New left pleural effusion.  Need stabilization of acute issues prior to formulation of disposition plan  Consultants:   Oncology  Pulmonary  Procedures:   None  Antimicrobials:   Rocephin, discontinued 09/20/2019  Zosyn, started 09/20/2019  Azithromycin, started 09/19/2019   Subjective: Patient seen and examined. Complains of cough and left-sided chest pain  Objective: Vitals:   09/21/19 0446 09/21/19 0608 09/21/19 0833 09/21/19 1203  BP: (!) 117/64  (!) 138/77 (!) 151/86  Pulse: 89 101 88 96  Resp: 20  20 (!) 24  Temp: (!) 100.8 F (38.2 C)  98.8 F (37.1 C) 98.4 F (36.9 C)  TempSrc: Oral  Oral Oral  SpO2: 95% 93% 96% 94%  Weight:      Height:        Intake/Output Summary (Last 24 hours) at 09/21/2019 1320 Last data filed at 09/21/2019 1100 Gross per 24 hour  Intake 1423.44 ml  Output 2325 ml  Net -901.56 ml   Filed Weights   09/19/19 0458 09/19/19 2152  Weight: 70.3 kg 68.6 kg    Examination:  General: No acute distress, appears stated age 26: Normocephalic, atraumatic, dry mucous membranes Neck: supple, trachea midline, no tenderness Heart: Regular rate and rhythm, S1/S2 normal, no murmurs Lungs: Lung sounds markedly decreased on left.  Normal work of breathing Abdomen: Soft, nontender, nondistended, positive bowel sounds Extremities: Normal, atraumatic, no clubbing or cyanosis,  normal muscle tone Skin: No rashes or lesions, normal color Neurologic: Cranial nerves grossly intact, sensation intact, alert and oriented x3 Psychiatric: Normal affect     Data Reviewed: I have personally reviewed following labs and imaging studies  CBC: Recent Labs  Lab 09/19/19 0518 09/20/19 0452 09/21/19 0419  WBC 11.7* 24.2* 26.9*  NEUTROABS 8.6*  --  23.9*  HGB 14.5 14.1 12.9*  HCT 42.3 42.5 36.8*  MCV 89.8 90.2 88.2  PLT 332 292 540   Basic Metabolic Panel: Recent Labs  Lab 09/19/19 0518 09/20/19 0452 09/21/19 0419  NA 139 135 136  K 3.6 4.0 4.2  CL 99 98 102  CO2 28 26 25   GLUCOSE 217* 165* 243*  BUN 21 19 18   CREATININE 1.26* 1.13 1.24  CALCIUM 8.9 8.8* 8.3*  MG  --   --  1.9   GFR: Estimated Creatinine Clearance: 56.1 mL/min (by C-G formula based on SCr of 1.24 mg/dL). Liver Function Tests: Recent Labs  Lab 09/19/19 0518  AST 22  ALT 28  ALKPHOS 110  BILITOT 1.0  PROT 8.2*  ALBUMIN 3.7   No results for input(s): LIPASE, AMYLASE in the last 168 hours. No results for input(s): AMMONIA in the last 168 hours. Coagulation Profile: Recent Labs  Lab 09/19/19 1224  INR 1.1   Cardiac Enzymes: No results for input(s): CKTOTAL, CKMB, CKMBINDEX, TROPONINI in the last 168 hours. BNP (last 3 results) No results for input(s): PROBNP in the last 8760 hours. HbA1C: No results for input(s): HGBA1C in the last 72 hours. CBG: Recent Labs  Lab 09/19/19 0927  GLUCAP 119*   Lipid Profile: No results for input(s): CHOL, HDL, LDLCALC, TRIG, CHOLHDL, LDLDIRECT in the last 72 hours. Thyroid Function Tests: No results for input(s): TSH, T4TOTAL, FREET4, T3FREE, THYROIDAB in the last 72 hours. Anemia Panel: No results for input(s): VITAMINB12, FOLATE, FERRITIN, TIBC, IRON, RETICCTPCT in the last 72 hours. Sepsis Labs: Recent Labs  Lab 09/20/19 0452 09/20/19 1455 09/20/19 1703  PROCALCITON 4.92  --   --   LATICACIDVEN  --  2.3* 1.8    Recent  Results (from the past 240 hour(s))  SARS Coronavirus 2 by RT PCR (hospital order, performed in [facility] hospital lab) Nasopharyngeal Nasopharyngeal Swab     Status: None   Collection Time: 09/19/19  8:40 AM   Specimen: Nasopharyngeal Swab  Result Value Ref Range Status   SARS Coronavirus 2 NEGATIVE NEGATIVE Final    Comment: (NOTE) SARS-CoV-2 target nucleic acids are NOT DETECTED.  The SARS-CoV-2 RNA is generally detectable in upper and lower respiratory specimens during the acute phase of infection. The lowest concentration of SARS-CoV-2 viral copies this assay can detect is 250 copies / mL. A negative result does not preclude SARS-CoV-2 infection and should not be used as the sole basis for treatment or other patient management decisions.  A negative result may occur with improper specimen collection / handling, submission of specimen other than nasopharyngeal swab, presence of viral mutation(s) within the areas targeted by this assay, and inadequate number of viral copies (<250 copies / mL). A negative result must be combined with clinical observations, patient history, and epidemiological information.  Fact Sheet for Patients:   StrictlyIdeas.no  Fact Sheet for Healthcare Providers: BankingDealers.co.za  This test is not yet approved or  cleared by the [place] FDA and has been authorized for detection and/or diagnosis of SARS-CoV-2 by FDA under an Emergency Use Authorization (EUA).  This EUA will remain in effect (meaning this test can be used) for the duration of the COVID-19 declaration under Section 564(b)(1) of the Act, 21 U.S.C. section 360bbb-3(b)(1), unless the authorization is terminated or revoked sooner.  Performed at [facility], [address] [place], [place] [address]   CULTURE, BLOOD (ROUTINE X 2) w Reflex to ID Panel     Status: None (Preliminary result)   Collection Time: 09/19/19  6:34 PM     Specimen: BLOOD  Result Value Ref Range Status   Specimen Description BLOOD LEFT ANTECUBITAL  Final   Special Requests   Final    BOTTLES DRAWN AEROBIC AND ANAEROBIC Blood Culture adequate volume   Culture   Final    NO GROWTH 2 DAYS Performed at [facility], [address], [place], [place] [address]    Report Status PENDING  Incomplete  CULTURE, BLOOD (ROUTINE X 2) w Reflex to ID Panel     Status: None (Preliminary result)   Collection Time: 09/19/19  6:34 PM   Specimen: BLOOD  Result Value Ref Range Status   Specimen Description BLOOD BLOOD LEFT HAND  Final   Special Requests   Final    BOTTLES DRAWN AEROBIC AND ANAEROBIC Blood Culture adequate volume   Culture   Final  NO GROWTH 2 DAYS Performed at [facility], [address] [place], [place] [address]    Report Status PENDING  Incomplete  Expectorated sputum assessment w rflx to resp cult     Status: None   Collection Time: 09/20/19  5:23 AM   Specimen: Sputum  Result Value Ref Range Status   Specimen Description SPUTUM  Final   Special Requests NONE  Final   Sputum evaluation   Final    THIS SPECIMEN IS ACCEPTABLE FOR SPUTUM CULTURE Performed at [facility], [address], [place], [place] [address]    Report Status 09/20/2019 FINAL  Final  Culture, respiratory     Status: None (Preliminary result)   Collection Time: 09/20/19  5:23 AM   Specimen: SPU  Result Value Ref Range Status   Specimen Description   Final    SPUTUM Performed at [facility], [address] [place], [place] [address]    Special Requests   Final    NONE Reflexed from [id number] Performed at [facility], [address] [place], [place] [address]    Gram Stain   Final    MODERATE WBC PRESENT,BOTH PMN AND MONONUCLEAR RARE SQUAMOUS EPITHELIAL CELLS PRESENT RARE GRAM POSITIVE COCCI IN PAIRS RARE GRAM POSITIVE RODS    Culture   Final    CULTURE REINCUBATED FOR BETTER GROWTH Performed  at [facility], [place] [address], [place],  [address]    Report Status PENDING  Incomplete         Radiology Studies: DG Chest 1 View  Result Date: 09/21/2019 CLINICAL DATA:  69 year old with progressive shortness of breath with exertion over night and acute respiratory failure. Fever and cough. EXAM: Portable CHEST 1 VIEW COMPARISON:  CTA chest 09/19/2019. FINDINGS: Since the CT 2 days ago, marked increase in size of the now very large LEFT pleural effusion. Worsening dense consolidation in the LEFT LOWER LOBE and lingula. New small RIGHT pleural effusion and associated mild passive atelectasis in the RIGHT LOWER LOBE. RIGHT lung remains clear otherwise. Heart moderately enlarged. Pulmonary vascularity normal. IMPRESSION: 1. Marked increase in size of the now large LEFT pleural effusion since the CT 2 days ago. 2. Worsening dense atelectasis and/or pneumonia in the LEFT LOWER LOBE and lingula. 3. New small RIGHT pleural effusion and associated mild passive atelectasis in the RIGHT LOWER LOBE. Electronically Signed   By: Evangeline Dakin M.D.   On: 09/21/2019 12:49        Scheduled Meds: . benzonatate  200 mg Oral TID  . gabapentin  300 mg Oral TID  . ketorolac  15 mg Intravenous Q6H   Continuous Infusions: . sodium chloride Stopped (09/21/19 0010)  . azithromycin Stopped (09/20/19 2359)  . piperacillin-tazobactam (ZOSYN)  IV 12.5 mL/hr at 09/21/19 0555  . vancomycin 750 mg (09/21/19 0758)     LOS: 1 day    Time spent: 25 minutes    Sidney Ace, MD [facility] Pager [phone]  If 7PM-7AM, please contact night-coverage 09/21/2019, 1:20 PM

## 2019-09-21 NOTE — Progress Notes (Signed)
Patient has had increased dyspnea with exertion during the night. The slightest bit of activity takes a lot out of him. 3 liters nasal cannula was administered to keep saturation at 92% and above. I tried to wean him from 02 this am  but was unsuccessful . Tylenol given for temp of 100.8, robitussin given several times for cough.  Wbc's are up this morning to 26 from 24 yesterday . NP Sharion Settler notified of wbc, fever, and dyspnea. Alan Wolf

## 2019-09-21 NOTE — Progress Notes (Signed)
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    SUBJECTIVE   Patient is laying in bed in no acute distress, daugther is at bedside.  He is on 1-2L/min supplemental O2 via [place] and saturates 92% while speaking. He is mildly tachy and diaphoretic with low grade fever overnight.   Noted worsening left pleural effusion , discussed plan for diagnostic thoracentesis. Remainder of care plan remains unchanged.    PAST MEDICAL HISTORY   History reviewed. No pertinent past medical history.   SURGICAL HISTORY   History reviewed. No pertinent surgical history.   FAMILY HISTORY   Family History  Problem Relation Age of Onset  . Diabetes Mellitus II Mother   . Lung cancer Brother      SOCIAL HISTORY   Social History   Tobacco Use  . Smoking status: Never Smoker  . Smokeless tobacco: Never Used  Substance Use Topics  . Alcohol use: Never  . Drug use: Never     MEDICATIONS    Home Medication:    Current Medication:  Current Facility-Administered Medications:  .  0.9 %  sodium chloride infusion, , Intravenous, PRN, Alan Wolf, Sudheer B, MD, Last Rate: 5 mL/hr at 09/21/19 1400, 250 mL at 09/21/19 1400 .  acetaminophen (TYLENOL) tablet 650 mg, 650 mg, Oral, Q6H PRN, Alan Wolf B, MD, 650 mg at 09/21/19 0550 .  albuterol (PROVENTIL) (2.5 MG/3ML) 0.083% nebulizer solution 2.5 mg, 2.5 mg, Nebulization, Q4H PRN, [name], MD .  azithromycin (ZITHROMAX) 500 mg in sodium chloride 0.9 % 250 mL IVPB, 500 mg, Intravenous, Q24H, [name], MD, Stopped at 09/20/19 2359 .  benzonatate (TESSALON) capsule 200 mg, 200 mg, Oral, TID, Alan Wolf, Sudheer B, MD, 200 mg at 09/21/19 0837 .  diphenhydrAMINE (BENADRYL) capsule 25 mg, 25 mg, Oral, Q6H  PRN, Alan Wolf, Sudheer B, MD .  furosemide (LASIX) injection 40 mg, 40 mg, Intravenous, Once, Alan Wolf, Sudheer B, MD .  gabapentin (NEURONTIN) tablet 300 mg, 300 mg, Oral, TID, Alan Wolf, Sudheer B, MD, 300 mg at 09/21/19 0837 .  ketorolac (TORADOL) 15 MG/ML injection 15 mg, 15 mg, Intravenous, Q6H, Alan Wolf, Sudheer B, MD, 15 mg at 09/21/19 1405 .  morphine 2 MG/ML injection 2 mg, 2 mg, Intravenous, Q3H PRN, Alan Wolf, Sudheer B, MD .  ondansetron (ZOFRAN) injection 4 mg, 4 mg, Intravenous, Q8H PRN, [name], MD .  oxyCODONE-acetaminophen (PERCOCET/ROXICET) 5-325 MG per tablet 1 tablet, 1 tablet, Oral, Q4H PRN, [name], MD, 1 tablet at 09/19/19 1625 .  piperacillin-tazobactam (ZOSYN) IVPB 3.375 g, 3.375 g, Intravenous, Q8H, Alan Wolf, RPH, Last Rate: 12.5 mL/hr at 09/21/19 1401, 3.375 g at 09/21/19 1401 .  promethazine-codeine (PHENERGAN with CODEINE) 6.25-10 MG/5ML syrup 5 mL, 5 mL, Oral, Q6H PRN, Alan Wolf, Sudheer B, MD .  vancomycin (VANCOREADY) IVPB 750 mg/150 mL, 750 mg, Intravenous, Q12H, Alan Wolf, Sudheer B, MD, Last Rate: 150 mL/hr at 09/21/19 0758, 750 mg at 09/21/19 0758    ALLERGIES   Penicillins     REVIEW OF SYSTEMS    Review of Systems:  Gen:  Denies  fever, sweats, chills weigh loss  HEENT: Denies blurred vision, double vision,  ear pain, eye pain, hearing loss, nose bleeds, sore throat Cardiac:  No dizziness, chest pain or heaviness, chest tightness,edema Resp:   Denies cough or sputum porduction, shortness of breath,wheezing, hemoptysis,  Gi: Denies swallowing difficulty, stomach pain, nausea or vomiting, diarrhea, constipation, bowel incontinence Gu:  Denies bladder incontinence, burning urine Ext:   Denies Joint pain, stiffness or swelling Skin: Denies  skin rash, easy bruising or bleeding or hives Endoc:  Denies polyuria, polydipsia , polyphagia or weight change Psych:   Denies depression, insomnia or hallucinations   Other:  All other systems  negative   VS: BP (!) 151/86 (BP Location: Right Arm)   Pulse 96   Temp 98.4 F (36.9 C) (Oral)   Resp (!) 24   Ht 5\' 11"  (1.803 m)   Wt 68.6 kg   SpO2 94%   BMI 21.09 kg/m      PHYSICAL EXAM    GENERAL:NAD, no fevers, chills, no weakness no fatigue HEAD: Normocephalic, atraumatic.  EYES: Pupils equal, round, reactive to light. Extraocular muscles intact. No scleral icterus.  MOUTH: Moist mucosal membrane. Dentition intact. No abscess noted.  EAR, NOSE, THROAT: Clear without exudates. No external lesions.  NECK: Supple. No thyromegaly. No nodules. No JVD.  PULMONARY: Diffuse coarse rhonchi right sided +wheezes CARDIOVASCULAR: S1 and S2. Regular rate and rhythm. No murmurs, rubs, or gallops. No edema. Pedal pulses 2+ bilaterally.  GASTROINTESTINAL: Soft, nontender, nondistended. No masses. Positive bowel sounds. No hepatosplenomegaly.  MUSCULOSKELETAL: No swelling, clubbing, or edema. Range of motion full in all extremities.  NEUROLOGIC: Cranial nerves II through XII are intact. No gross focal neurological deficits. Sensation intact. Reflexes intact.  SKIN: No ulceration, lesions, rashes, or cyanosis. Skin warm and dry. Turgor intact.  PSYCHIATRIC: Mood, affect within normal limits. The patient is awake, alert and oriented x 3. Insight, judgment intact.       IMAGING    DG Chest 1 View  Result Date: 09/21/2019 CLINICAL DATA:  69 year old with progressive shortness of breath with exertion over night and acute respiratory failure. Fever and cough. EXAM: Portable CHEST 1 VIEW COMPARISON:  CTA chest 09/19/2019. FINDINGS: Since the CT 2 days ago, marked increase in size of the now very large LEFT pleural effusion. Worsening dense consolidation in the LEFT LOWER LOBE and lingula. New small RIGHT pleural effusion and associated mild passive atelectasis in the RIGHT LOWER LOBE. RIGHT lung remains clear otherwise. Heart moderately enlarged. Pulmonary vascularity normal. IMPRESSION: 1.  Marked increase in size of the now large LEFT pleural effusion since the CT 2 days ago. 2. Worsening dense atelectasis and/or pneumonia in the LEFT LOWER LOBE and lingula. 3. New small RIGHT pleural effusion and associated mild passive atelectasis in the RIGHT LOWER LOBE. Electronically Signed   By: Evangeline Dakin M.D.   On: 09/21/2019 12:49   CT Angio Chest PE W and/or Wo Contrast  Result Date: 09/19/2019 CLINICAL DATA:  Chest pain with pleurisy or effusion suspected EXAM: CT ANGIOGRAPHY CHEST WITH CONTRAST TECHNIQUE: Multidetector CT imaging of the chest was performed using the standard protocol during bolus administration of intravenous contrast. Multiplanar CT image reconstructions and MIPs were obtained to evaluate the vascular anatomy. CONTRAST:  44mL OMNIPAQUE IOHEXOL 350 MG/ML SOLN COMPARISON:  Abdominal CT 09/12/2019 FINDINGS: Cardiovascular: Normal heart size. No pericardial effusion. Atheromatous calcification along the aorta and left main coronary. Mediastinum/Nodes: Mildly enlarged bronchial lymph nodes the left lower lobe, 11 mm in diameter. Lungs/Pleura: Ovoid area of decreased enhancement and vessel attenuation measuring up to  5.7 cm on axial slices. There is adjacent enhancing compressed appearing lung. Small left pleural effusion with some scalloping. Upper Abdomen: Negative Musculoskeletal: Negative for bone erosion. No evidence of osseous metastatic disease. Review of the MIP images confirms the above findings. IMPRESSION: 1. Concern for left lower lobe mass with bronchial adenopathy. Recommend referral to multi disciplinary thoracic oncology. Findings are not definitive for malignancy and could be related to round atelectasis or atypical pneumonia, consider antibiotic therapy in the meantime. 2. Small left pleural effusion complicated by areas of loculation. 3. Negative for pulmonary embolism. Electronically Signed   By: Monte Fantasia M.D.   On: 09/19/2019 06:44   US Venous Img Lower  Bilateral (DVT)  Result Date: 09/19/2019 CLINICAL DATA:  Elevated D-dimer.  Evaluate for DVT. EXAM: BILATERAL LOWER EXTREMITY VENOUS DOPPLER ULTRASOUND TECHNIQUE: Gray-scale sonography with graded compression, as well as color Doppler and duplex ultrasound were performed to evaluate the lower extremity deep venous systems from the level of the common femoral vein and including the common femoral, femoral, profunda femoral, popliteal and calf veins including the posterior tibial, peroneal and gastrocnemius veins when visible. The superficial great saphenous vein was also interrogated. Spectral Doppler was utilized to evaluate flow at rest and with distal augmentation maneuvers in the common femoral, femoral and popliteal veins. COMPARISON:  None. FINDINGS: RIGHT LOWER EXTREMITY Common Femoral Vein: No evidence of thrombus. Normal compressibility, respiratory phasicity and response to augmentation. Saphenofemoral Junction: No evidence of thrombus. Normal compressibility and flow on color Doppler imaging. Profunda Femoral Vein: No evidence of thrombus. Normal compressibility and flow on color Doppler imaging. Femoral Vein: No evidence of thrombus. Normal compressibility, respiratory phasicity and response to augmentation. Popliteal Vein: No evidence of thrombus. Normal compressibility, respiratory phasicity and response to augmentation. Calf Veins: No evidence of thrombus. Normal compressibility and flow on color Doppler imaging. Superficial Great Saphenous Vein: No evidence of thrombus. Normal compressibility. Venous Reflux:  None. Other Findings:  None. LEFT LOWER EXTREMITY Common Femoral Vein: No evidence of thrombus. Normal compressibility, respiratory phasicity and response to augmentation. Saphenofemoral Junction: No evidence of thrombus. Normal compressibility and flow on color Doppler imaging. Profunda Femoral Vein: No evidence of thrombus. Normal compressibility and flow on color Doppler imaging. Femoral  Vein: No evidence of thrombus. Normal compressibility, respiratory phasicity and response to augmentation. Popliteal Vein: No evidence of thrombus. Normal compressibility, respiratory phasicity and response to augmentation. Calf Veins: No evidence of thrombus. Normal compressibility and flow on color Doppler imaging. Superficial Great Saphenous Vein: No evidence of thrombus. Normal compressibility. Venous Reflux:  None. Other Findings:  None. IMPRESSION: No evidence of DVT within either lower extremity. Electronically Signed   By: Sandi Mariscal M.D.   On: 09/19/2019 12:29   CT RENAL STONE STUDY  Result Date: 09/12/2019 CLINICAL DATA:  LEFT flank and LEFT lower quadrant pain for 10-12 days, no history of kidney stones or surgery EXAM: CT ABDOMEN AND PELVIS WITHOUT CONTRAST TECHNIQUE: Multidetector CT imaging of the abdomen and pelvis was performed following the standard protocol without IV contrast. Sagittal and coronal MPR images reconstructed from axial data set. No oral contrast. COMPARISON:  None FINDINGS: Lower chest: Small LEFT pleural effusion with partial atelectasis of LEFT lower lobe. Hepatobiliary: Gallbladder and liver normal appearance Pancreas: Normal appearance Spleen: Normal appearance Adrenals/Urinary Tract: Adrenal glands, kidneys, ureters, and bladder normal appearance per Stomach/Bowel: Normal appendix. Minimally prominent stool in rectum. Large and small bowel loops otherwise unremarkable. No definite gastric abnormalities. Vascular/Lymphatic: Atherosclerotic calcifications aorta and iliac arteries  without aneurysm. No adenopathy. Reproductive: Prostatic enlargement, gland measuring 4.7 x 4.3 cm image 72. Other: No free air or free fluid. No inflammatory process. Small RIGHT inguinal hernia containing fat. Musculoskeletal: Small lucent focus within posterior aspect of L4 vertebral body RIGHT of midline, nonspecific. No additional osseous abnormalities. IMPRESSION: Small LEFT pleural effusion  with partial atelectasis of LEFT lower lobe. Small RIGHT inguinal hernia containing fat. Prostatic enlargement. No acute intra-abdominal or intrapelvic abnormalities. Small nonspecific lucent focus within the posterior aspect of the L4 vertebral body, nonspecific, cannot exclude myeloma or potentially lytic metastasis; correlation with workup for myeloma recommended. Aortic Atherosclerosis (ICD10-I70.0). Electronically Signed   By: Lavonia Dana M.D.   On: 09/12/2019 11:00      ASSESSMENT/PLAN   Left lung mass with associated hilar lymphadenopathy   - possible infectious vs malignant in etiology    - agree with empiric tx for CAP   - will ask for diagnostic thoracentesis although likely not enough fluid    - will need interval imaging /PET to re-evaluate    - will discuss and plan for bronchoscopic evaluation     -agree with workup including stre pneumoniae and legionella    - fungitell is in process    -procalcitonin is elevated at 4.92 -nasal MRSA in process   -patient is on vancomycin/zosyn empirically - Tmax today 09/20/19-103.2        Left lung pleural effusion and Atelectasis  -will add recruitment maneuvers via [facility] q8h with albuterol  and mucomyst 80ml bid 20% to regimen  -thoracentesis - to eval for parapneumonic process/empyema vs malignant potential  Thank you for allowing me to participate in the care of this patient.    Patient/Family are satisfied with care plan and all questions have been answered.  This document was prepared using Dragon voice recognition software and may include unintentional dictation errors.     Ottie Glazier, M.D.  Division of [place]

## 2019-09-22 ENCOUNTER — Inpatient Hospital Stay: Payer: Medicare HMO

## 2019-09-22 ENCOUNTER — Inpatient Hospital Stay
Admit: 2019-09-22 | Discharge: 2019-09-22 | Disposition: A | Discharge status: Home / Self Care | Payer: Medicare HMO | Attending: Internal Medicine | Admitting: Internal Medicine

## 2019-09-22 LAB — ECHOCARDIOGRAM COMPLETE
Area-P 1/2: 2.39 cm2
Height: 71 in
S' Lateral: 2.64 cm
Weight: 2419.77 oz

## 2019-09-22 LAB — BASIC METABOLIC PANEL
Anion gap: 10 (ref 5–15)
BUN: 19 mg/dL (ref 8–23)
CO2: 29 mmol/L (ref 22–32)
Calcium: 8.4 mg/dL — ABNORMAL LOW (ref 8.9–10.3)
Chloride: 98 mmol/L (ref 98–111)
Creatinine, Ser: 1.32 mg/dL — ABNORMAL HIGH (ref 0.61–1.24)
GFR calc Af Amer: >60 mL/min (ref >60)
GFR calc non Af Amer: 55 mL/min — ABNORMAL LOW (ref >60)
Glucose, Bld: 224 mg/dL — ABNORMAL HIGH (ref 70–99)
Potassium: 3.6 mmol/L (ref 3.5–5.1)
Sodium: 137 mmol/L (ref 135–145)

## 2019-09-22 LAB — CBC WITH DIFFERENTIAL/PLATELET
Abs Immature Granulocytes: 0.24 10*3/uL — ABNORMAL HIGH (ref 0.00–0.07)
Basophils Absolute: 0.1 10*3/uL (ref 0.0–0.1)
Basophils Relative: 0 %
Eosinophils Absolute: 0.1 10*3/uL (ref 0.0–0.5)
Eosinophils Relative: 1 %
HCT: 38 % — ABNORMAL LOW (ref 39.0–52.0)
Hemoglobin: 12.8 g/dL — ABNORMAL LOW (ref 13.0–17.0)
Immature Granulocytes: 1 %
Lymphocytes Relative: 4 %
Lymphs Abs: 0.9 10*3/uL (ref 0.7–4.0)
MCH: 30.3 pg (ref 26.0–34.0)
MCHC: 33.7 g/dL (ref 30.0–36.0)
MCV: 89.8 fL (ref 80.0–100.0)
Monocytes Absolute: 1.3 10*3/uL — ABNORMAL HIGH (ref 0.1–1.0)
Monocytes Relative: 6 %
Neutro Abs: 19.2 10*3/uL — ABNORMAL HIGH (ref 1.7–7.7)
Neutrophils Relative %: 88 %
Platelets: 248 10*3/uL (ref 150–400)
RBC: 4.23 MIL/uL (ref 4.22–5.81)
RDW: 12.4 % (ref 11.5–15.5)
WBC: 21.8 10*3/uL — ABNORMAL HIGH (ref 4.0–10.5)
nRBC: 0 % (ref 0.0–0.2)

## 2019-09-22 LAB — KAPPA/LAMBDA LIGHT CHAINS
Kappa free light chain: 24.4 mg/L — ABNORMAL HIGH (ref 3.3–19.4)
Kappa, lambda light chain ratio: 1.37 (ref 0.26–1.65)
Lambda free light chains: 17.8 mg/L (ref 5.7–26.3)

## 2019-09-22 LAB — LEGIONELLA PNEUMOPHILA SEROGP 1 UR AG: L. pneumophila Serogp 1 Ur Ag: NEGATIVE

## 2019-09-22 LAB — CULTURE, RESPIRATORY W GRAM STAIN: Culture: NORMAL

## 2019-09-22 MED ORDER — FUROSEMIDE 10 MG/ML IJ SOLN
40.0000 mg | Freq: Once | INTRAMUSCULAR | Status: AC
  Administered 2019-09-22: 40 mg via INTRAVENOUS
  Filled 2019-09-22: qty 4

## 2019-09-22 MED ORDER — LIDOCAINE 5 % EX PTCH
1.0000 | MEDICATED_PATCH | CUTANEOUS | Status: DC
  Administered 2019-09-22 – 2019-09-27 (×6): 1 via TRANSDERMAL
  Filled 2019-09-22 (×6): qty 1

## 2019-09-22 NOTE — Progress Notes (Signed)
PROGRESS NOTE    Alan Wolf  CZY:[id number] DOB: 12-10-1950 DOA: 09/19/2019 PCP: Marguerita Merles, MD   Brief Narrative:  HPI: Alan Wolf is a 69 y.o. male without significant past medical history, who presents with left flank pain.  Patient states that he has been having left flank pain for almost 1 month.  The pain is located in the left flank area, constant, sharp, moderate to severe, nonradiating.  It is aggravated by movement or deep breath.  Patient has mild cough and mild shortness of breath.  Occasionally he coughs up streaks of pink-colored mucus.  Denies fever or chills.He was initially seen and thought to have pneumonia on chest x-ray. He got 2 different antibiotics but is not any better.  Patient does not have nausea, vomiting, diarrhea, abdominal pain, symptoms of UTI or unilateral weakness.   Pt was seen in ED 09/12/19, and had CT-renal stone protocol which showed small LEFT pleural effusion and a small nonspecific lucent focus within the posterior aspect of the L4 vertebral body. Pt states that he has appointment with pulmonologist next Monday.  7/30: Patient seen and examined. Patient's daughter is at bedside. Long discussion regarding lung findings, preceding symptoms, ongoing work-up. Patient himself complains of left side and flank pain especially worse when he coughs.  7/31: Patient seen and examined.  Overnight patient became more dyspneic requiring 3 L supplemental oxygen.  Stat chest x-ray performed this morning showed significant interval increase in size of left pleural effusion.  Fluids discontinued.  Patient given Lasix 40 mg IV x1.  On my evaluation patient is breathing little better.  He is still dependent on 2 to 3 L nasal cannula.  Fever T-max 100.8 noted this morning.  Remains on broad-spectrum IV antibiotics.  All cultures no growth to date.  8/1: Patient seen and examined.  Continues to spike intermittent fevers.  White count decreasing.  Remains on 2 L  nasal cannula.  This morning is endorsing shortness of breath.  Chest x-ray demonstrates persistent large left pleural effusion with likely associated pneumonia and atelectasis.  Ultrasound-guided thoracentesis ordered.  Likely will not happen until tomorrow.  All cultures no growth to date.   Assessment & Plan:   Principal Problem:   Mass of lower lobe of left lung Active Problems:   Leukocytosis   Left flank pain  Lung mass Possible community acquired pneumonia Sepsis secondary to above   CTA showed 5.7 cm left lower lobe mass with bronchial adenopathy.  CT also showed small left pleural effusion complicated by areas of loculation.  Etiology is not clear.  Pt had CT-renal stone protocol on 7/22, which showed small LEFT pleural effusion and small nonspecific lucent focus within the posterior aspect of the L4 vertebral body.  Differential diagnosis include malignancy, atypical infection.   Oncology, Dr. Rogue Bussing is consulted.   Also consulted pulmonologist, Dr. Lanney Gins -Patient had some fevers over interval and elevated procalcitonin, raising the possibility of atypical infection. Unable to exclude malignancy at this time.  -Worsening respiratory status associated with interval development of large left pleural effusion.  Cough and associated chest pain improved Plan: Continue broad-spectrum antibiotics, vancomycin, Zosyn, azithromycin Follow blood urine and sputum cultures, no growth to date As needed bronchodilators MetaNeb every 8 hours with albuterol added Mucomyst twice daily Cough suppression, Tessalon Perles and promethazine/codeine Check strep urinary antigen, pending Check Legionella urinary antigen, pending Check Fungitell, pending Diagnostic and therapeutic thoracentesis tomorrow 09/23/19   Acute hypoxic respiratory failure Patient with large left pleural effusion.  Persistent despite treatment IV Lasix.  Plan for diagnostic and therapeutic thoracentesis  Plan: No  IV fluids at this time Repeat Lasix 40 mg IV x1 Left thoracentesis ordered.  Fluid studies including cytology and flow cytometry ordered MetaNeb every 8 hour for atelectasis and alveolar recruitment Appreciate any further pulmonary recommendations Diagnostic and therapeutic thoracentesis  Leukocytosis:  WBC 11.7-->24-->26-->21 -on antibiotics as above  Left flank pain:  Due to left lung mass -As needed Percocet and morphine for pain -Add scheduled Toradol -Add scheduled gabapentin -Add lidoderm patch   DVT prophylaxis: Lovenox Code Status: Full Family Communication: Daughter at bedside 8/1 Disposition Plan: Status is: Inpatient  Remains inpatient appropriate because:Inpatient level of care appropriate due to severity of illness   Dispo: The patient is from: Home              Anticipated d/c is to: Home              Anticipated d/c date is: 3 days              Patient currently is not medically stable to d/c.  Sepsis, acute hypoxic respiratory failure secondary to suspected pneumonia with the possibility of pulmonary malignancy.  Plan for diagnostic and therapeutic thoracentesis tomorrow.  Consultants:   Oncology  Pulmonary  Procedures:   None  Antimicrobials:   Rocephin, discontinued 09/20/2019  Zosyn, started 09/20/2019  Azithromycin, started 09/19/2019  Vancomycin, started 09/20/2019   Subjective: Patient seen and examined.  Complains of shortness of breath.  Cough and chest wall pain improved  Objective: Vitals:   09/22/19 0500 09/22/19 0741 09/22/19 1109 09/22/19 1157  BP:    (!) 146/79  Pulse:    97  Resp: 23 (!) 25  20  Temp:    (!) 101.3 F (38.5 C)  TempSrc:    Oral  SpO2:   96% 93%  Weight:      Height:        Intake/Output Summary (Last 24 hours) at 09/22/2019 1419 Last data filed at 09/22/2019 0900 Gross per 24 hour  Intake 973.03 ml  Output 1400 ml  Net -426.97 ml   Filed Weights   09/19/19 0458 09/19/19 2152  Weight: 70.3 kg  68.6 kg    Examination:  General: Mild respiratory distress HEENT: Normocephalic, atraumatic Neck, supple, trachea midline, no tenderness Heart: Regular rate and rhythm, S1/S2 normal, no murmurs Lungs: Lung sounds markedly diminished on the left.  No wheeze  abdomen: Soft, nontender, nondistended, positive bowel sounds Extremities: Normal, atraumatic, no clubbing or cyanosis, normal muscle tone Skin: No rashes or lesions, normal color Neurologic: Cranial nerves grossly intact, sensation intact, alert and oriented x3 Psychiatric: Normal affect      Data Reviewed: I have personally reviewed following labs and imaging studies  CBC: Recent Labs  Lab 09/19/19 0518 09/20/19 0452 09/21/19 0419 09/22/19 0336  WBC 11.7* 24.2* 26.9* 21.8*  NEUTROABS 8.6*  --  23.9* 19.2*  HGB 14.5 14.1 12.9* 12.8*  HCT 42.3 42.5 36.8* 38.0*  MCV 89.8 90.2 88.2 89.8  PLT 332 292 238 010   Basic Metabolic Panel: Recent Labs  Lab 09/19/19 0518 09/20/19 0452 09/21/19 0419 09/22/19 0336  NA 139 135 136 137  K 3.6 4.0 4.2 3.6  CL 99 98 102 98  CO2 28 26 25 29   GLUCOSE 217* 165* 243* 224*  BUN 21 19 18 19   CREATININE 1.26* 1.13 1.24 1.32*  CALCIUM 8.9 8.8* 8.3* 8.4*  MG  --   --  1.9  --    GFR: Estimated Creatinine Clearance: 52.7 mL/min (A) (by C-G formula based on SCr of 1.32 mg/dL (H)). Liver Function Tests: Recent Labs  Lab 09/19/19 0518  AST 22  ALT 28  ALKPHOS 110  BILITOT 1.0  PROT 8.2*  ALBUMIN 3.7   No results for input(s): LIPASE, AMYLASE in the last 168 hours. No results for input(s): AMMONIA in the last 168 hours. Coagulation Profile: Recent Labs  Lab 09/19/19 1224  INR 1.1   Cardiac Enzymes: No results for input(s): CKTOTAL, CKMB, CKMBINDEX, TROPONINI in the last 168 hours. BNP (last 3 results) No results for input(s): PROBNP in the last 8760 hours. HbA1C: No results for input(s): HGBA1C in the last 72 hours. CBG: Recent Labs  Lab 09/19/19 0927  GLUCAP  119*   Lipid Profile: No results for input(s): CHOL, HDL, LDLCALC, TRIG, CHOLHDL, LDLDIRECT in the last 72 hours. Thyroid Function Tests: No results for input(s): TSH, T4TOTAL, FREET4, T3FREE, THYROIDAB in the last 72 hours. Anemia Panel: No results for input(s): VITAMINB12, FOLATE, FERRITIN, TIBC, IRON, RETICCTPCT in the last 72 hours. Sepsis Labs: Recent Labs  Lab 09/20/19 0452 09/20/19 1455 09/20/19 1703  PROCALCITON 4.92  --   --   LATICACIDVEN  --  2.3* 1.8    Recent Results (from the past 240 hour(s))  SARS Coronavirus 2 by RT PCR (hospital order, performed in [facility] hospital lab) Nasopharyngeal Nasopharyngeal Swab     Status: None   Collection Time: 09/19/19  8:40 AM   Specimen: Nasopharyngeal Swab  Result Value Ref Range Status   SARS Coronavirus 2 NEGATIVE NEGATIVE Final    Comment: (NOTE) SARS-CoV-2 target nucleic acids are NOT DETECTED.  The SARS-CoV-2 RNA is generally detectable in upper and lower respiratory specimens during the acute phase of infection. The lowest concentration of SARS-CoV-2 viral copies this assay can detect is 250 copies / mL. A negative result does not preclude SARS-CoV-2 infection and should not be used as the sole basis for treatment or other patient management decisions.  A negative result may occur with improper specimen collection / handling, submission of specimen other than nasopharyngeal swab, presence of viral mutation(s) within the areas targeted by this assay, and inadequate number of viral copies (<250 copies / mL). A negative result must be combined with clinical observations, patient history, and epidemiological information.  Fact Sheet for Patients:   StrictlyIdeas.no  Fact Sheet for Healthcare Providers: BankingDealers.co.za  This test is not yet approved or  cleared by the [place] FDA and has been authorized for detection and/or diagnosis of SARS-CoV-2 by FDA  under an Emergency Use Authorization (EUA).  This EUA will remain in effect (meaning this test can be used) for the duration of the COVID-19 declaration under Section 564(b)(1) of the Act, 21 U.S.C. section 360bbb-3(b)(1), unless the authorization is terminated or revoked sooner.  Performed at [facility], [address] [place], [place] [address]   CULTURE, BLOOD (ROUTINE X 2) w Reflex to ID Panel     Status: None (Preliminary result)   Collection Time: 09/19/19  6:34 PM   Specimen: BLOOD  Result Value Ref Range Status   Specimen Description BLOOD LEFT ANTECUBITAL  Final   Special Requests   Final    BOTTLES DRAWN AEROBIC AND ANAEROBIC Blood Culture adequate volume   Culture   Final    NO GROWTH 3 DAYS Performed at [facility], [address] [place], [place] [address]    Report Status  PENDING  Incomplete  CULTURE, BLOOD (ROUTINE X 2) w Reflex to ID Panel     Status: None (Preliminary result)   Collection Time: 09/19/19  6:34 PM   Specimen: BLOOD  Result Value Ref Range Status   Specimen Description BLOOD BLOOD LEFT HAND  Final   Special Requests   Final    BOTTLES DRAWN AEROBIC AND ANAEROBIC Blood Culture adequate volume   Culture   Final    NO GROWTH 3 DAYS Performed at [facility], [address], [place], [place] [address]    Report Status PENDING  Incomplete  Expectorated sputum assessment w rflx to resp cult     Status: None   Collection Time: 09/20/19  5:23 AM   Specimen: Sputum  Result Value Ref Range Status   Specimen Description SPUTUM  Final   Special Requests NONE  Final   Sputum evaluation   Final    THIS SPECIMEN IS ACCEPTABLE FOR SPUTUM CULTURE Performed at [facility], [address] [place], [place] [address]    Report Status 09/20/2019 FINAL  Final  Culture, respiratory     Status: None   Collection Time: 09/20/19  5:23 AM   Specimen: SPU  Result Value Ref Range Status   Specimen Description   Final     SPUTUM Performed at [facility], [address] [place], [place] [address]    Special Requests   Final    NONE Reflexed from [id number] Performed at [facility], [place], [place] [address]    Gram Stain   Final    MODERATE WBC PRESENT,BOTH PMN AND MONONUCLEAR RARE SQUAMOUS EPITHELIAL CELLS PRESENT RARE GRAM POSITIVE COCCI IN PAIRS RARE GRAM POSITIVE RODS    Culture   Final    Consistent with normal respiratory flora. Performed at [facility], [place] [address], [place], [place] [address]    Report Status 09/22/2019 FINAL  Final  MRSA PCR Screening     Status: None   Collection Time: 09/20/19  1:34 PM   Specimen: Nasopharyngeal  Result Value Ref Range Status   MRSA by PCR NEGATIVE NEGATIVE Final    Comment:        The GeneXpert MRSA Assay (FDA approved for NASAL specimens only), is one component of a comprehensive MRSA colonization surveillance program. It is not intended to diagnose MRSA infection nor to guide or monitor treatment for MRSA infections. Performed at [facility], [address] [place], [place] [address]          Radiology Studies: DG Chest 1 View  Result Date: 09/21/2019 CLINICAL DATA:  69 year old with progressive shortness of breath with exertion over night and acute respiratory failure. Fever and cough. EXAM: Portable CHEST 1 VIEW COMPARISON:  CTA chest 09/19/2019. FINDINGS: Since the CT 2 days ago, marked increase in size of the now very large LEFT pleural effusion. Worsening dense consolidation in the LEFT LOWER LOBE and lingula. New small RIGHT pleural effusion and associated mild passive atelectasis in the RIGHT LOWER LOBE. RIGHT lung remains clear otherwise. Heart moderately enlarged. Pulmonary vascularity normal. IMPRESSION: 1. Marked increase in size of the now large LEFT pleural effusion since the CT 2 days ago. 2. Worsening dense atelectasis and/or pneumonia in the LEFT LOWER LOBE and lingula. 3.  New small RIGHT pleural effusion and associated mild passive atelectasis in the RIGHT LOWER LOBE. Electronically Signed   By: Evangeline Dakin M.D.   On: 09/21/2019 12:49   DG  Chest Port 1 View  Result Date: 09/22/2019 CLINICAL DATA:  Pleural effusion. EXAM: PORTABLE CHEST 1 VIEW COMPARISON:  Chest x-ray dated 09/21/2019. FINDINGS: Near complete opacification of the LEFT hemithorax, stable to slightly increased compared to yesterday's chest x-ray. Probable small RIGHT pleural effusion and/or RIGHT basilar atelectasis. RIGHT lung remains otherwise clear. No pneumothorax is seen. Cardiomediastinal size and position is grossly stable. IMPRESSION: 1. Near complete opacification of the LEFT hemithorax, stable to slightly increased compared to yesterday's chest x-ray, significantly increased compared to the chest CT angiogram of 09/19/2019. This is presumably a combination of large pleural effusion and LEFT lower lung pneumonia versus atelectasis. 2. Probable small pleural effusion and/or atelectasis at the RIGHT lung base. Electronically Signed   By: Franki Cabot M.D.   On: 09/22/2019 12:09        Scheduled Meds: . acetylcysteine  4 mL Nebulization BID  . benzonatate  200 mg Oral TID  . furosemide  40 mg Intravenous Once  . gabapentin  300 mg Oral TID  . ketorolac  15 mg Intravenous Q6H   Continuous Infusions: . sodium chloride Stopped (09/22/19 0512)  . azithromycin Stopped (09/21/19 1912)  . piperacillin-tazobactam (ZOSYN)  IV 3.375 g (09/22/19 0512)  . vancomycin 750 mg (09/22/19 0553)     LOS: 2 days    Time spent: 25 minutes    Sidney Ace, MD [facility] Pager [phone]  If 7PM-7AM, please contact night-coverage 09/22/2019, 2:19 PM

## 2019-09-23 ENCOUNTER — Inpatient Hospital Stay: Payer: Medicare HMO

## 2019-09-23 ENCOUNTER — Encounter: Payer: Self-pay | Admitting: Internal Medicine

## 2019-09-23 LAB — BASIC METABOLIC PANEL
Anion gap: 11 (ref 5–15)
BUN: 22 mg/dL (ref 8–23)
CO2: 29 mmol/L (ref 22–32)
Calcium: 8.4 mg/dL — ABNORMAL LOW (ref 8.9–10.3)
Chloride: 95 mmol/L — ABNORMAL LOW (ref 98–111)
Creatinine, Ser: 1.49 mg/dL — ABNORMAL HIGH (ref 0.61–1.24)
GFR calc Af Amer: 55 mL/min — ABNORMAL LOW (ref >60)
GFR calc non Af Amer: 48 mL/min — ABNORMAL LOW (ref >60)
Glucose, Bld: 258 mg/dL — ABNORMAL HIGH (ref 70–99)
Potassium: 3.4 mmol/L — ABNORMAL LOW (ref 3.5–5.1)
Sodium: 135 mmol/L (ref 135–145)

## 2019-09-23 LAB — CBC WITH DIFFERENTIAL/PLATELET
Abs Immature Granulocytes: 0.11 10*3/uL — ABNORMAL HIGH (ref 0.00–0.07)
Basophils Absolute: 0 10*3/uL (ref 0.0–0.1)
Basophils Relative: 0 %
Eosinophils Absolute: 0.1 10*3/uL (ref 0.0–0.5)
Eosinophils Relative: 1 %
HCT: 38.4 % — ABNORMAL LOW (ref 39.0–52.0)
Hemoglobin: 12.7 g/dL — ABNORMAL LOW (ref 13.0–17.0)
Immature Granulocytes: 1 %
Lymphocytes Relative: 8 %
Lymphs Abs: 1.4 10*3/uL (ref 0.7–4.0)
MCH: 29.9 pg (ref 26.0–34.0)
MCHC: 33.1 g/dL (ref 30.0–36.0)
MCV: 90.4 fL (ref 80.0–100.0)
Monocytes Absolute: 1.4 10*3/uL — ABNORMAL HIGH (ref 0.1–1.0)
Monocytes Relative: 8 %
Neutro Abs: 13.7 10*3/uL — ABNORMAL HIGH (ref 1.7–7.7)
Neutrophils Relative %: 82 %
Platelets: 282 10*3/uL (ref 150–400)
RBC: 4.25 MIL/uL (ref 4.22–5.81)
RDW: 12.5 % (ref 11.5–15.5)
WBC: 16.7 10*3/uL — ABNORMAL HIGH (ref 4.0–10.5)
nRBC: 0 % (ref 0.0–0.2)

## 2019-09-23 MED ORDER — VANCOMYCIN HCL IN DEXTROSE 1-5 GM/200ML-% IV SOLN
1000.0000 mg | INTRAVENOUS | Status: DC
  Administered 2019-09-24 – 2019-09-25 (×2): 1000 mg via INTRAVENOUS
  Filled 2019-09-23 (×3): qty 200

## 2019-09-23 MED ORDER — MIDAZOLAM HCL 5 MG/5ML IJ SOLN
INTRAMUSCULAR | Status: AC
  Filled 2019-09-23: qty 5

## 2019-09-23 MED ORDER — MIDAZOLAM HCL 5 MG/5ML IJ SOLN
INTRAMUSCULAR | Status: AC | PRN
  Administered 2019-09-23: 1 mg via INTRAVENOUS

## 2019-09-23 MED ORDER — ORAL CARE MOUTH RINSE
15.0000 mL | Freq: Two times a day (BID) | OROMUCOSAL | Status: DC
  Administered 2019-09-23 – 2019-09-25 (×4): 15 mL via OROMUCOSAL

## 2019-09-23 MED ORDER — FENTANYL CITRATE (PF) 100 MCG/2ML IJ SOLN
INTRAMUSCULAR | Status: AC
  Filled 2019-09-23: qty 2

## 2019-09-23 MED ORDER — IOHEXOL 300 MG/ML  SOLN
75.0000 mL | Freq: Once | INTRAMUSCULAR | Status: AC | PRN
  Administered 2019-09-23: 75 mL via INTRAVENOUS

## 2019-09-23 MED ORDER — FENTANYL CITRATE (PF) 100 MCG/2ML IJ SOLN
INTRAMUSCULAR | Status: AC | PRN
  Administered 2019-09-23: 50 ug via INTRAVENOUS

## 2019-09-23 NOTE — Progress Notes (Signed)
Pharmacy Antibiotic Note  Alan Wolf is a 69 y.o. male admitted on 09/19/2019 with sepsis.  Pharmacy was consulted for vancomycin dosing. This is day # 4 of broad-spectrum antibiotics. Since admission his renal function has gradually worsened and he continues to have febrile episodes with SOB although his leukocytosis has improved but not yet resolved.  Vancomycin Plan:  Adjust vancomycin dose to 1000 mg IV every 24 hours  Ke: 0.037 h-1, T1/2: 18.6h  Css(calculated): 34.2/14.5 mcg/mL  Random vancomycin level in am  SCr in am to assess renal function  Height: 5\' 11"  (180.3 cm) Weight: 68.6 kg (151 lb 3.8 oz) IBW/kg (Calculated) : 75.3  Temp (24hrs), Avg:99.5 F (37.5 C), Min:97.8 F (36.6 C), Max:101 F (38.3 C)  Recent Labs  Lab 09/19/19 0518 09/20/19 0452 09/20/19 1455 09/20/19 1703 09/21/19 0419 09/22/19 0336 09/23/19 0422  WBC 11.7* 24.2*  --   --  26.9* 21.8* 16.7*  CREATININE 1.26* 1.13  --   --  1.24 1.32* 1.49*  LATICACIDVEN  --   --  2.3* 1.8  --   --   --     Estimated Creatinine Clearance: 46.7 mL/min (A) (by C-G formula based on SCr of 1.49 mg/dL (H)).    Allergies  Allergen Reactions  . Penicillins     Per pt - when a child, it made the fever go up instead of down    Antimicrobials this admission: azithromycin 7/29  >>  Zosyn 7/30  >>  Vancomycin 7/30 >>  Microbiology results: 7/29 BCx: NG x 4 days 7/29 SARS CoV-2: negative 7/30 Sputum: rare GPC pairs, GPR  7/31 MRSA PCR: negative  Thank you for allowing pharmacy to be a part of this patient's care.  Dallie Piles 09/23/2019 2:55 PM

## 2019-09-23 NOTE — Sedation Documentation (Signed)
Total conscious sedation: Versed 1 mg IV, Fenatnyl 50 mcg IV. Pt. Tolerated procedure well.

## 2019-09-23 NOTE — Progress Notes (Signed)
PROGRESS NOTE    Alan Wolf  OFB:[id number] DOB: November 16, 1950 DOA: 09/19/2019 PCP: Marguerita Merles, MD   Brief Narrative:  HPI: Alan Wolf is a 69 y.o. male without significant past medical history, who presents with left flank pain.  Patient states that he has been having left flank pain for almost 1 month.  The pain is located in the left flank area, constant, sharp, moderate to severe, nonradiating.  It is aggravated by movement or deep breath.  Patient has mild cough and mild shortness of breath.  Occasionally he coughs up streaks of pink-colored mucus.  Denies fever or chills.He was initially seen and thought to have pneumonia on chest x-ray. He got 2 different antibiotics but is not any better.  Patient does not have nausea, vomiting, diarrhea, abdominal pain, symptoms of UTI or unilateral weakness.   Pt was seen in ED 09/12/19, and had CT-renal stone protocol which showed small LEFT pleural effusion and a small nonspecific lucent focus within the posterior aspect of the L4 vertebral body. Pt states that he has appointment with pulmonologist next Monday.  7/30: Patient seen and examined. Patient's daughter is at bedside. Long discussion regarding lung findings, preceding symptoms, ongoing work-up. Patient himself complains of left side and flank pain especially worse when he coughs.  7/31: Patient seen and examined.  Overnight patient became more dyspneic requiring 3 L supplemental oxygen.  Stat chest x-ray performed this morning showed significant interval increase in size of left pleural effusion.  Fluids discontinued.  Patient given Lasix 40 mg IV x1.  On my evaluation patient is breathing little better.  He is still dependent on 2 to 3 L nasal cannula.  Fever T-max 100.8 noted this morning.  Remains on broad-spectrum IV antibiotics.  All cultures no growth to date.  8/1: Patient seen and examined.  Continues to spike intermittent fevers.  White count decreasing.  Remains on 2 L  nasal cannula.  This morning is endorsing shortness of breath.  Chest x-ray demonstrates persistent large left pleural effusion with likely associated pneumonia and atelectasis.  Ultrasound-guided thoracentesis ordered.  Likely will not happen until tomorrow.  All cultures no growth to date.  8/2: Patient seen and examined.  Fevers noted overnight.  White blood cell count continues to decrease.  Remains on 3 L nasal cannula.  Shortness of breath persistent.  Unfortunately this morning the patient was sent for an ultrasound-guided thoracentesis.  Communicated with Dr. Shon Baton interventional radiology.  Thoracentesis could not be performed due to complex and likely loculated in nature of the pleural effusion.  Dr. Pascal Lux recommending repeat chest CT with contrast and CTS evaluation.  Communicated with Dr. Genevive Bi who will consult on patient.   Assessment & Plan:   Principal Problem:   Mass of lower lobe of left lung Active Problems:   Leukocytosis   Left flank pain  Lung mass Possible community acquired pneumonia Sepsis secondary to above   CTA showed 5.7 cm left lower lobe mass with bronchial adenopathy.  CT also showed small left pleural effusion complicated by areas of loculation.  Etiology is not clear.  Pt had CT-renal stone protocol on 7/22, which showed small LEFT pleural effusion and small nonspecific lucent focus within the posterior aspect of the L4 vertebral body.  Differential diagnosis include malignancy, atypical infection.   Oncology, Dr. Rogue Bussing is consulted.   Also consulted pulmonologist, Dr. Lanney Gins -Patient had some fevers over interval and elevated procalcitonin, raising the possibility of atypical infection. Unable to exclude malignancy at  this time.  -Worsening respiratory status associated with interval development of large left pleural effusion.  Cough and associated chest pain improved Plan: Continue broad-spectrum antibiotics, vancomycin, Zosyn, azithromycin Follow  blood urine and sputum cultures, no growth to date As needed bronchodilators MetaNeb every 8 hours with albuterol added Mucomyst twice daily Cough suppression, Tessalon Perles and promethazine/codeine Check strep urinary antigen, negative Check Legionella urinary antigen, negative Check Fungitell, pending Thoracentesis was unsuccessful due to loculated nature suspect dense loculations of pleural effusion.  Communicated with cardiothoracic surgery and pulmonology.  CT chest with contrast ordered.  CT-guided pleural drain ordered.  Once pleural drain is in place cardiothoracic surgery will continue to follow for intrapleural TPA.  VATS as last resort.   Acute hypoxic respiratory failure Patient with large left pleural effusion.  Persistent despite treatment IV Lasix.  Plan for diagnostic and therapeutic thoracentesis  Plan: No IV fluids at this time Repeat Lasix 40 mg IV x1 Left thoracentesis ordered.  Fluid studies including cytology and flow cytometry ordered MetaNeb every 8 hour for atelectasis and alveolar recruitment Appreciate any further pulmonary recommendations Chest tube to be placed today 09/24/19  Leukocytosis:  WBC 11.7-->24-->26-->21-->16 -on antibiotics as above  Left flank pain:  Due to left lung mass -As needed Percocet and morphine for pain -Add scheduled Toradol -Add scheduled gabapentin -Add lidoderm patch   DVT prophylaxis: Lovenox Code Status: Full Family Communication: Daughter at bedside 8/2 Disposition Plan: Status is: Inpatient  Remains inpatient appropriate because:Inpatient level of care appropriate due to severity of illness   Dispo: The patient is from: Home              Anticipated d/c is to: Home              Anticipated d/c date is: 3 days              Patient currently is not medically stable to d/c.  Sepsis, acute hypoxic respiratory failure secondary to suspected pneumonia with the possibility of pulmonary malignancy.  Loculated pleural  effusion.  Suspicion for parapneumonic effusion.  Chest tube to be placed today.  Pulmonary and cardiothoracic following.  Consultants:   Oncology  Pulmonary  Procedures:   None  Antimicrobials:   Rocephin, discontinued 09/20/2019  Zosyn, started 09/20/2019  Azithromycin, started 09/19/2019  Vancomycin, started 09/20/2019   Subjective: Patient seen and examined.  Continues to endorse dyspnea.  Cough and chest wall pain improved. Objective: Vitals:   09/23/19 0513 09/23/19 0636 09/23/19 1100 09/23/19 1309  BP: (!) 130/58   114/62  Pulse: 99   92  Resp: 20   16  Temp: (!) 100.4 F (38 C) 99.4 F (37.4 C)  (!) 101 F (38.3 C)  TempSrc: Oral Oral  Oral  SpO2: 97%  95% 97%  Weight:      Height:        Intake/Output Summary (Last 24 hours) at 09/23/2019 1503 Last data filed at 09/23/2019 1100 Gross per 24 hour  Intake 946.28 ml  Output 550 ml  Net 396.28 ml   Filed Weights   09/19/19 0458 09/19/19 2152  Weight: 70.3 kg 68.6 kg    Examination:  General: Mild respiratory distress HEENT: Normocephalic, atraumatic Neck, supple, trachea midline, no tenderness Heart: Regular rate and rhythm, S1/S2 normal, no murmurs Lungs: Lung sounds markedly diminished on the left.  No wheeze  abdomen: Soft, nontender, nondistended, positive bowel sounds Extremities: Normal, atraumatic, no clubbing or cyanosis, normal muscle tone Skin: No rashes or lesions,  normal color Neurologic: Cranial nerves grossly intact, sensation intact, alert and oriented x3 Psychiatric: Normal affect      Data Reviewed: I have personally reviewed following labs and imaging studies  CBC: Recent Labs  Lab 09/19/19 0518 09/20/19 0452 09/21/19 0419 09/22/19 0336 09/23/19 0422  WBC 11.7* 24.2* 26.9* 21.8* 16.7*  NEUTROABS 8.6*  --  23.9* 19.2* 13.7*  HGB 14.5 14.1 12.9* 12.8* 12.7*  HCT 42.3 42.5 36.8* 38.0* 38.4*  MCV 89.8 90.2 88.2 89.8 90.4  PLT 332 292 238 248 706   Basic Metabolic  Panel: Recent Labs  Lab 09/19/19 0518 09/20/19 0452 09/21/19 0419 09/22/19 0336 09/23/19 0422  NA 139 135 136 137 135  K 3.6 4.0 4.2 3.6 3.4*  CL 99 98 102 98 95*  CO2 28 26 25 29 29   GLUCOSE 217* 165* 243* 224* 258*  BUN 21 19 18 19 22   CREATININE 1.26* 1.13 1.24 1.32* 1.49*  CALCIUM 8.9 8.8* 8.3* 8.4* 8.4*  MG  --   --  1.9  --   --    GFR: Estimated Creatinine Clearance: 46.7 mL/min (A) (by C-G formula based on SCr of 1.49 mg/dL (H)). Liver Function Tests: Recent Labs  Lab 09/19/19 0518  AST 22  ALT 28  ALKPHOS 110  BILITOT 1.0  PROT 8.2*  ALBUMIN 3.7   No results for input(s): LIPASE, AMYLASE in the last 168 hours. No results for input(s): AMMONIA in the last 168 hours. Coagulation Profile: Recent Labs  Lab 09/19/19 1224  INR 1.1   Cardiac Enzymes: No results for input(s): CKTOTAL, CKMB, CKMBINDEX, TROPONINI in the last 168 hours. BNP (last 3 results) No results for input(s): PROBNP in the last 8760 hours. HbA1C: No results for input(s): HGBA1C in the last 72 hours. CBG: Recent Labs  Lab 09/19/19 0927  GLUCAP 119*   Lipid Profile: No results for input(s): CHOL, HDL, LDLCALC, TRIG, CHOLHDL, LDLDIRECT in the last 72 hours. Thyroid Function Tests: No results for input(s): TSH, T4TOTAL, FREET4, T3FREE, THYROIDAB in the last 72 hours. Anemia Panel: No results for input(s): VITAMINB12, FOLATE, FERRITIN, TIBC, IRON, RETICCTPCT in the last 72 hours. Sepsis Labs: Recent Labs  Lab 09/20/19 0452 09/20/19 1455 09/20/19 1703  PROCALCITON 4.92  --   --   LATICACIDVEN  --  2.3* 1.8    Recent Results (from the past 240 hour(s))  SARS Coronavirus 2 by RT PCR (hospital order, performed in [facility] hospital lab) Nasopharyngeal Nasopharyngeal Swab     Status: None   Collection Time: 09/19/19  8:40 AM   Specimen: Nasopharyngeal Swab  Result Value Ref Range Status   SARS Coronavirus 2 NEGATIVE NEGATIVE Final    Comment: (NOTE) SARS-CoV-2 target nucleic acids  are NOT DETECTED.  The SARS-CoV-2 RNA is generally detectable in upper and lower respiratory specimens during the acute phase of infection. The lowest concentration of SARS-CoV-2 viral copies this assay can detect is 250 copies / mL. A negative result does not preclude SARS-CoV-2 infection and should not be used as the sole basis for treatment or other patient management decisions.  A negative result may occur with improper specimen collection / handling, submission of specimen other than nasopharyngeal swab, presence of viral mutation(s) within the areas targeted by this assay, and inadequate number of viral copies (<250 copies / mL). A negative result must be combined with clinical observations, patient history, and epidemiological information.  Fact Sheet for Patients:   StrictlyIdeas.no  Fact Sheet for Healthcare Providers: BankingDealers.co.za  This  test is not yet approved or  cleared by the [place] and has been authorized for detection and/or diagnosis of SARS-CoV-2 by FDA under an Emergency Use Authorization (EUA).  This EUA will remain in effect (meaning this test can be used) for the duration of the COVID-19 declaration under Section 564(b)(1) of the Act, 21 U.S.C. section 360bbb-3(b)(1), unless the authorization is terminated or revoked sooner.  Performed at [facility], [address] [place], [place] [address]   CULTURE, BLOOD (ROUTINE X 2) w Reflex to ID Panel     Status: None (Preliminary result)   Collection Time: 09/19/19  6:34 PM   Specimen: BLOOD  Result Value Ref Range Status   Specimen Description BLOOD LEFT ANTECUBITAL  Final   Special Requests   Final    BOTTLES DRAWN AEROBIC AND ANAEROBIC Blood Culture adequate volume   Culture   Final    NO GROWTH 4 DAYS Performed at [facility], [address], [place], [place] [address]    Report Status PENDING  Incomplete  CULTURE,  BLOOD (ROUTINE X 2) w Reflex to ID Panel     Status: None (Preliminary result)   Collection Time: 09/19/19  6:34 PM   Specimen: BLOOD  Result Value Ref Range Status   Specimen Description BLOOD BLOOD LEFT HAND  Final   Special Requests   Final    BOTTLES DRAWN AEROBIC AND ANAEROBIC Blood Culture adequate volume   Culture   Final    NO GROWTH 4 DAYS Performed at [facility], [address], [place], [place] [address]    Report Status PENDING  Incomplete  Expectorated sputum assessment w rflx to resp cult     Status: None   Collection Time: 09/20/19  5:23 AM   Specimen: Sputum  Result Value Ref Range Status   Specimen Description SPUTUM  Final   Special Requests NONE  Final   Sputum evaluation   Final    THIS SPECIMEN IS ACCEPTABLE FOR SPUTUM CULTURE Performed at [facility], [address] [place], [place] [address]    Report Status 09/20/2019 FINAL  Final  Culture, respiratory     Status: None   Collection Time: 09/20/19  5:23 AM   Specimen: SPU  Result Value Ref Range Status   Specimen Description   Final    SPUTUM Performed at [facility], [address] [place], [place] [address]    Special Requests   Final    NONE Reflexed from [phone] Performed at [facility], [place], [place] [address]    Gram Stain   Final    MODERATE WBC PRESENT,BOTH PMN AND MONONUCLEAR RARE SQUAMOUS EPITHELIAL CELLS PRESENT RARE GRAM POSITIVE COCCI IN PAIRS RARE GRAM POSITIVE RODS    Culture   Final    Consistent with normal respiratory flora. Performed at [facility], [place] [address], [place], [place] [address]    Report Status 09/22/2019 FINAL  Final  MRSA PCR Screening     Status: None   Collection Time: 09/20/19  1:34 PM   Specimen: Nasopharyngeal  Result Value Ref Range Status   MRSA by PCR NEGATIVE NEGATIVE Final    Comment:        The GeneXpert MRSA Assay (FDA approved for NASAL specimens only), is one component of  a comprehensive MRSA colonization surveillance program. It is not intended to diagnose MRSA infection nor to guide or monitor treatment for MRSA infections. Performed at [facility], [address], [place], [place] [address]          Radiology Studies: [place] CHEST (PLEURAL EFFUSION)  Result Date: 09/23/2019 CLINICAL DATA:  Concern for worsening left-sided pleural effusion. EXAM: CHEST ULTRASOUND COMPARISON:  Chest CT-09/19/2019; chest radiograph-09/22/2019 FINDINGS: Sonographic evaluation of the left hemithorax demonstrates apparent dense consolidation of the left lung (favored) versus densely loculated pleural fluid, not amenable to ultrasound-guided thoracentesis. No thoracentesis attempted. IMPRESSION: Dense consolidation of the left lung (favored) versus densely loculated pleural fluid, not amenable to ultrasound-guided thoracentesis. No thoracentesis attempted. Recommend proceeding with repeat contrast-enhanced chest CT for further evaluation. Critical Value/emergent results were relayed to referring physician via epic messenger at the time of interpretation on 09/23/2019 at 10:37 am to provider [facility] , who acknowledged these results. Electronically Signed   By: Sandi Mariscal M.D.   On: 09/23/2019 10:38   DG Chest Port 1 View  Result Date: 09/22/2019 CLINICAL DATA:  Pleural effusion. EXAM: PORTABLE CHEST 1 VIEW COMPARISON:  Chest x-ray dated 09/21/2019. FINDINGS: Near complete opacification of the LEFT hemithorax, stable to slightly increased compared to yesterday's chest x-ray. Probable small RIGHT pleural effusion and/or RIGHT basilar atelectasis. RIGHT lung remains otherwise clear. No pneumothorax is seen. Cardiomediastinal size and position is grossly stable. IMPRESSION: 1. Near complete opacification of the LEFT hemithorax, stable to slightly increased compared to yesterday's chest x-ray, significantly increased compared to the chest CT angiogram of 09/19/2019. This is  presumably a combination of large pleural effusion and LEFT lower lung pneumonia versus atelectasis. 2. Probable small pleural effusion and/or atelectasis at the RIGHT lung base. Electronically Signed   By: Franki Cabot M.D.   On: 09/22/2019 12:09   ECHOCARDIOGRAM COMPLETE  Result Date: 09/22/2019    ECHOCARDIOGRAM REPORT   Patient Name:   JERAMEY LANUZA Date of Exam: 09/22/2019 Medical Rec #:  [id number]        Height:       71.0 in Accession #:    [id number]       Weight:       151.2 lb Date of Birth:  1950-09-15        BSA:          1.872 m Patient Age:    41 years         BP:           120/69 mmHg Patient Gender: M                HR:           94 bpm. Exam Location:  [facility] Procedure: 2D Echo Indications:     CHF-Acute Diastolic 081.44/Y18.56  History:         Patient has no prior history of Echocardiogram examinations.  Sonographer:     Avanell Shackleton Referring Phys:  [id number] Sidney Ace Diagnosing Phys: Serafina Royals MD IMPRESSIONS  1. Left ventricular ejection fraction, by estimation, is 65 to 70%. The left ventricle has normal function. The left ventricle has no regional wall motion abnormalities. Left ventricular diastolic parameters were normal.  2. Right ventricular systolic function is normal. The right ventricular size is normal. There is moderately elevated pulmonary artery systolic pressure.  3. The mitral valve is normal in structure. Trivial mitral valve regurgitation.  4. The aortic valve is normal in structure. Aortic valve regurgitation is trivial. FINDINGS  Left Ventricle: Left ventricular ejection fraction, by estimation, is 65 to 70%. The left ventricle has normal function. The left ventricle has  no regional wall motion abnormalities. The left ventricular internal cavity size was small. There is no left ventricular hypertrophy. Left ventricular diastolic parameters were normal. Right Ventricle: The right ventricular size is normal. No increase in right ventricular wall thickness.  Right ventricular systolic function is normal. There is moderately elevated pulmonary artery systolic pressure. The tricuspid regurgitant velocity is 3.16 m/s, and with an assumed right atrial pressure of 10 mmHg, the estimated right ventricular systolic pressure is 40.1 mmHg. Left Atrium: Left atrial size was normal in size. Right Atrium: Right atrial size was normal in size. Pericardium: There is no evidence of pericardial effusion. Mitral Valve: The mitral valve is normal in structure. Trivial mitral valve regurgitation. Tricuspid Valve: The tricuspid valve is normal in structure. Tricuspid valve regurgitation is not demonstrated. Aortic Valve: The aortic valve is normal in structure. Aortic valve regurgitation is trivial. Pulmonic Valve: The pulmonic valve was normal in structure. Pulmonic valve regurgitation is not visualized. Aorta: The aortic root and ascending aorta are structurally normal, with no evidence of dilitation. IAS/Shunts: No atrial level shunt detected by color flow Doppler.  LEFT VENTRICLE PLAX 2D LVIDd:         4.26 cm  Diastology LVIDs:         2.64 cm  LV e' lateral:   8.05 cm/s LV PW:         0.68 cm  LV E/e' lateral: 8.7 LV IVS:        0.71 cm  LV e' medial:    8.05 cm/s LVOT diam:     2.00 cm  LV E/e' medial:  8.7 LVOT Area:     3.14 cm  RIGHT VENTRICLE             IVC RV S prime:     19.70 cm/s  IVC diam: 2.32 cm TAPSE (M-mode): 2.8 cm LEFT ATRIUM             Index       RIGHT ATRIUM           Index LA diam:        2.80 cm 1.50 cm/m  RA Area:     16.00 cm LA Vol (A2C):   35.9 ml 19.17 ml/m RA Volume:   41.20 ml  22.00 ml/m LA Vol (A4C):   35.0 ml 18.69 ml/m LA Biplane Vol: 35.1 ml 18.75 ml/m   AORTA Ao Root diam: 3.00 cm MITRAL VALVE               TRICUSPID VALVE MV Area (PHT): 2.39 cm    TR Peak grad:   39.9 mmHg MV Decel Time: 318 msec    TR Vmax:        316.00 cm/s MV E velocity: 70.00 cm/s MV A velocity: 93.00 cm/s  SHUNTS MV E/A ratio:  0.75        Systemic Diam: 2.00 cm  Serafina Royals MD Electronically signed by Serafina Royals MD Signature Date/Time: 09/22/2019/7:30:23 PM    Final         Scheduled Meds: . acetylcysteine  4 mL Nebulization BID  . benzonatate  200 mg Oral TID  . gabapentin  300 mg Oral TID  . ketorolac  15 mg Intravenous Q6H  . lidocaine  1 patch Transdermal Q24H  . mouth rinse  15 mL Mouth Rinse BID   Continuous Infusions: . sodium chloride Stopped (09/23/19 0527)  . azithromycin Stopped (09/22/19 1903)  . piperacillin-tazobactam (ZOSYN)  IV 3.375 g (09/23/19 1431)  . [  START ON 09/24/2019] vancomycin       LOS: 3 days    Time spent: 25 minutes    Sidney Ace, MD [facility] Pager [phone]  If 7PM-7AM, please contact night-coverage 09/23/2019, 3:03 PM

## 2019-09-23 NOTE — Procedures (Signed)
Pre procedural Dx: Complex left sided pleural effusion Post procedural Dx: Same  Technically successful CT guided placed of a 16 Fr drainage catheter placement into the left pleural space yielding 650 cc of dark yellow fluid.    A representative aspirated sample was capped and sent to the laboratory for analysis.    EBL: Trace Complications: None immediate  Ronny Bacon, MD Pager #: [phone]

## 2019-09-23 NOTE — Care Management Important Message (Signed)
Important Message  Patient Details  Name: Alan Wolf MRN: [id number] Date of Birth: Oct 19, 1950   Medicare Important Message Given:  Yes     Juliann Pulse A Oneda Duffett 09/23/2019, 1:29 PM

## 2019-09-23 NOTE — Progress Notes (Signed)
Patient ID: Alan Wolf, male   DOB: 01-09-1951, 68 y.o.   MRN: [id number]  Chief Complaint  Patient presents with  . Chest Pain    Referred By Dr, [name] Reason for Referral complex left pleural effusion  HPI Location, Quality, Duration, Severity, Timing, Context, Modifying Factors, Associated Signs and Symptoms.  Alan Wolf is a 69 y.o. male.  I had the opportunity to see this patient today with his daughter present in the room.  His problems began about a month ago when he experienced a cough and some left-sided flank pain.  Initially he was seen by an urgent care center who placed the patient on antibiotics.  He subsequently had 3 rounds of antibiotics including a short dose of prednisone.  He has been on azithromycin, clindamycin and Levaquin.  His cough and pain progressed and about a week and a half ago he presented to our emergency room where a CT scan of the abdomen was performed to assess for kidney stones.  There is no evidence of kidney stones but a left lower lobe pneumonic process was identified.  Whether this was a malignancy or a infection was unclear at the time.  Patient states that he has been coughing up a moderate amount of sputum with occasional blood-tinged.  He is also had some fevers.  At home he would take some Tylenol if his temperature reached over 99.5.  This would help with the fever and with the pain.  He came back into the hospital because of continued symptoms of chest pain cough and fever.  A chest x-ray and CT scan of been performed and this revealed a progressive pleural effusion on the left.  He also had an elevated white blood cell count and had an attempted an ultrasound-guided thoracentesis today.  That could not be performed secondary to the loculated nature of the fluid.  He is currently scheduled to get a CT-guided drain placed.  He was able to walk around the nurses station today.  He states that he feels better than when he first came in the  hospital but still feels more short of breath and at his baseline.  He smoked half a pack a day about 30 years ago.  He works in the [employer] and more recently as a Psychologist, sport and exercise.  He has had no prior surgical procedures and has not seen a physician for any chronic medical problems.   History reviewed. No pertinent past medical history.  History reviewed. No pertinent surgical history.  Family History  Problem Relation Age of Onset  . Diabetes Mellitus II Mother   . Lung cancer Brother     Social History Social History   Tobacco Use  . Smoking status: Never Smoker  . Smokeless tobacco: Never Used  Substance Use Topics  . Alcohol use: Never  . Drug use: Never    Allergies  Allergen Reactions  . Penicillins     Per pt - when a child, it made the fever go up instead of down    Current Facility-Administered Medications  Medication Dose Route Frequency Provider Last Rate Last Admin  . 0.9 %  sodium chloride infusion   Intravenous PRN Sidney Ace, MD   Stopped at 09/23/19 [phone]  . acetaminophen (TYLENOL) tablet 650 mg  650 mg Oral Q6H PRN [name] B, MD   650 mg at 09/23/19 0531  . acetylcysteine (MUCOMYST) 20 % nebulizer / oral solution 4 mL  4 mL Nebulization BID Ottie Glazier, MD  4 mL at 09/23/19 0805  . albuterol (PROVENTIL) (2.5 MG/3ML) 0.083% nebulizer solution 2.5 mg  2.5 mg Nebulization Q4H PRN [name], MD   2.5 mg at 09/23/19 0805  . azithromycin (ZITHROMAX) 500 mg in sodium chloride 0.9 % 250 mL IVPB  500 mg Intravenous Q24H [name], MD   Stopped at 09/22/19 1903  . benzonatate (TESSALON) capsule 200 mg  200 mg Oral TID [name] B, MD   200 mg at 09/23/19 1053  . diphenhydrAMINE (BENADRYL) capsule 25 mg  25 mg Oral Q6H PRN Sreenath, Sudheer B, MD      . gabapentin (NEURONTIN) tablet 300 mg  300 mg Oral TID [name] B, MD   300 mg at 09/23/19 1052  . ketorolac (TORADOL) 15 MG/ML injection 15 mg  15 mg Intravenous Q6H Sreenath,  Sudheer B, MD   15 mg at 09/23/19 0531  . lidocaine (LIDODERM) 5 % 1 patch  1 patch Transdermal Q24H [name] B, MD   1 patch at 09/22/19 1740  . MEDLINE mouth rinse  15 mL Mouth Rinse BID Sreenath, Sudheer B, MD      . morphine 2 MG/ML injection 2 mg  2 mg Intravenous Q3H PRN [name] B, MD   2 mg at 09/23/19 1052  . ondansetron (ZOFRAN) injection 4 mg  4 mg Intravenous Q8H PRN [name], MD      . oxyCODONE-acetaminophen (PERCOCET/ROXICET) 5-325 MG per tablet 1 tablet  1 tablet Oral Q4H PRN [name], MD   1 tablet at 09/19/19 1625  . piperacillin-tazobactam (ZOSYN) IVPB 3.375 g  3.375 g Intravenous Q8H Lu Duffel, [place]   Stopped at 09/23/19 4163  . promethazine-codeine (PHENERGAN with CODEINE) 6.25-10 MG/5ML syrup 5 mL  5 mL Oral Q6H PRN Sreenath, Sudheer B, MD      . vancomycin (VANCOREADY) IVPB 750 mg/150 mL  750 mg Intravenous Q12H [name] B, MD   Stopped at 09/23/19 [phone]      Review of Systems A complete review of systems was asked and was negative except for the following positive findings as per history of present illness  Blood pressure (!) 130/58, pulse 99, temperature 99.4 F (37.4 C), temperature source Oral, resp. rate 20, height 5\' 11"  (1.803 m), weight 68.6 kg, SpO2 95 %.  Physical Exam CONSTITUTIONAL:  Pleasant, well-developed, well-nourished, and in no acute distress. EYES: Pupils equal and reactive to light, Sclera non-icteric EARS, NOSE, MOUTH AND THROAT:  The oropharynx was clear.  Dentition is poor repair.  Oral mucosa pink and moist. LYMPH NODES:  Lymph nodes in the neck and axillae were normal RESPIRATORY:  Lungs were clear but distant on the right and almost absent on the left.  Normal respiratory effort without pathologic use of accessory muscles of respiration CARDIOVASCULAR: Heart was regular without murmurs.  There were no carotid bruits. GI: The abdomen was soft, nontender, and nondistended. There were no palpable masses.  There was no hepatosplenomegaly. There were normal bowel sounds in all quadrants. GU:  Rectal deferred.   MUSCULOSKELETAL:  Normal muscle strength and tone.  No clubbing or cyanosis.   SKIN:  There were no pathologic skin lesions.  There were no nodules on palpation. NEUROLOGIC:  Sensation is normal.  Cranial nerves are grossly intact. PSYCH:  Oriented to person, place and time.  Mood and affect are normal.  Data Reviewed CT scans and chest x-rays  I have personally reviewed the patient's imaging, laboratory findings and medical records.  Assessment    I have independently reviewed the patient's CT scans and chest x-rays.  The rapid progression of the pleural effusion makes me suspect an infectious etiology as opposed to a malignancy.    Plan    I explained to the patient and to his daughter the indications and risks of pigtail catheter insertion by her interventional radiologist.  They understand that once the catheter is in place that we will administer intrapleural thrombolytics.  Risks of intrapleural thrombolytics including that of bleeding and infection were discussed.  The alternative would be surgical intervention which they would like to try to avoid if possible.  Once the drain is placed we will then be able to administer the medication.  I will continue to follow him with you.       Nestor Lewandowsky, MD 09/23/2019, 12:23 PM   Patient ID: Alan Wolf, male   DOB: 11-28-1950, 69 y.o.   MRN: [id number]

## 2019-09-23 NOTE — Consult Note (Signed)
Chief Complaint: Complex left-sided pleural effusion  Referring Physician(s): [facility]  Patient Status: Alan Wolf - In-pt  History of Present Illness: Alan Wolf is a 69 y.o. male with no significant past medical history admitted to [facility] with worsening shortness of breath found to have a progressive left-sided pleural effusion.  Patient underwent attempted ultrasound-guided thoracentesis today however thoracentesis was not performed given marked complexity of left-sided pleural fluid.  Patient has been evaluated by Dr. Genevive Bi of thoracic surgery and request has been made for image guided placement of a chest tube for infectious source control prior to potential definitive operative intervention with VATS.  Patient continues to complain of shortness of breath.  History reviewed. No pertinent past medical history.  History reviewed. No pertinent surgical history.  Allergies: Penicillins  Medications: Prior to Admission medications   Medication Sig Start Date End Date Taking? Authorizing Provider  guaiFENesin-codeine 100-10 MG/5ML syrup Take 5 mLs by mouth as directed. 09/09/19  Yes [provider]  naproxen (NAPROSYN) 500 MG tablet Take 500 mg by mouth 2 (two) times daily as needed. 09/12/19  Yes [provider]     Family History  Problem Relation Age of Onset  . Diabetes Mellitus II Mother   . Lung cancer Brother     Social History   Socioeconomic History  . Marital status: Married    Spouse name: Not on file  . Number of children: Not on file  . Years of education: Not on file  . Highest education level: Not on file  Occupational History  . Not on file  Tobacco Use  . Smoking status: Never Smoker  . Smokeless tobacco: Never Used  Substance and Sexual Activity  . Alcohol use: Never  . Drug use: Never  . Sexual activity: Not on file  Other Topics Concern  . Not on file  Social History Narrative   Remote smoker 30ppd;  quit year ago; [place]/family-wife [dementia] used to work in Tourist information centre manager.    Social Determinants of Health   Financial Resource Strain:   . Difficulty of Paying Living Expenses:   Food Insecurity:   . Worried About Charity fundraiser in the Last Year:   . Arboriculturist in the Last Year:   Transportation Needs:   . Film/video editor (Medical):   Marland Kitchen Lack of Transportation (Non-Medical):   Physical Activity:   . Days of Exercise per Week:   . Minutes of Exercise per Session:   Stress:   . Feeling of Stress :   Social Connections:   . Frequency of Communication with Friends and Family:   . Frequency of Social Gatherings with Friends and Family:   . Attends Religious Services:   . Active Member of Clubs or Organizations:   . Attends Archivist Meetings:   Marland Kitchen Marital Status:     ECOG Status: 2 - Symptomatic, <50% confined to bed  Review of Systems: A 12 point ROS discussed and pertinent positives are indicated in the HPI above.  All other systems are negative.  Review of Systems  Vital Signs: BP 133/76   Pulse 79   Temp (!) 101 F (38.3 C) (Oral)   Resp (!) 22   Ht 5\' 11"  (1.803 m)   Wt 68.6 kg   SpO2 91%   BMI 21.09 kg/m   Physical Exam  Imaging: DG Chest 1 View  Result Date: 09/21/2019 CLINICAL DATA:  69 year old with progressive shortness of breath with exertion  over night and acute respiratory failure. Fever and cough. EXAM: Portable CHEST 1 VIEW COMPARISON:  CTA chest 09/19/2019. FINDINGS: Since the CT 2 days ago, marked increase in size of the now very large LEFT pleural effusion. Worsening dense consolidation in the LEFT LOWER LOBE and lingula. New small RIGHT pleural effusion and associated mild passive atelectasis in the RIGHT LOWER LOBE. RIGHT lung remains clear otherwise. Heart moderately enlarged. Pulmonary vascularity normal. IMPRESSION: 1. Marked increase in size of the now large LEFT pleural effusion since the CT 2 days ago. 2.  Worsening dense atelectasis and/or pneumonia in the LEFT LOWER LOBE and lingula. 3. New small RIGHT pleural effusion and associated mild passive atelectasis in the RIGHT LOWER LOBE. Electronically Signed   By: Evangeline Dakin M.D.   On: 09/21/2019 12:49   CT CHEST W CONTRAST  Result Date: 09/23/2019 CLINICAL DATA:  69 year old male with abnormal x-ray. Pleural effusion. EXAM: CT CHEST WITH CONTRAST TECHNIQUE: Multidetector CT imaging of the chest was performed during intravenous contrast administration. CONTRAST:  84mL OMNIPAQUE IOHEXOL 300 MG/ML  SOLN COMPARISON:  Chest CT dated 09/19/2019 and radiograph dated 09/22/2019. FINDINGS: Cardiovascular: There is no cardiomegaly or pericardial effusion. Coronary vascular calcification primarily involving the LAD. The thoracic aorta is unremarkable. The origins of the great vessels of the aortic arch are patent as visualized. Evaluation of the pulmonary arteries is limited due to suboptimal visualization of the distal branches. No large or central pulmonary artery embolus identified. Mediastinum/Nodes: Mildly enlarged right hilar lymph node measures 12 mm in short axis. Subcarinal lymph node measures 11 mm. The esophagus is grossly unremarkable. No mediastinal fluid collection. Lungs/Pleura: There is a large left pleural effusion with probable areas of loculation. There is compressive atelectasis of the majority of the left lung with a small aerated portion of the left upper lobe. Small pockets of air within the effusion inferiorly may be related to recent procedure or represent an infectious process. A bronchopleural fistula is not excluded. There is a small right pleural effusion. Partial consolidative changes involving the right lung base and right middle lobe may represent atelectasis or infiltrate. Smaller ground-glass clusters in the right upper lobe may represent pneumonia. There is no pneumothorax. The central airways are patent. Upper Abdomen: No acute  abnormality. Musculoskeletal: No chest wall abnormality. No acute or significant osseous findings. IMPRESSION: 1. No central pulmonary artery embolus identified. 2. Large left pleural effusion with consolidation of the majority of the left lung. Small pockets of air within the effusion inferiorly may be related to recent procedure or represent an infectious process. A bronchopleural fistula is not excluded. 3. Small right pleural effusion. 4. Partial consolidative changes involving the right lung base and right middle lobe may represent atelectasis or infiltrate. Smaller ground-glass clusters in the right upper lobe may represent pneumonia. Clinical correlation and follow-up recommended. 5. Mildly enlarged right hilar and subcarinal lymph nodes, likely reactive. 6. Aortic Atherosclerosis (ICD10-I70.0). Electronically Signed   By: Anner Crete M.D.   On: 09/23/2019 15:46   CT Angio Chest PE W and/or Wo Contrast  Result Date: 09/19/2019 CLINICAL DATA:  Chest pain with pleurisy or effusion suspected EXAM: CT ANGIOGRAPHY CHEST WITH CONTRAST TECHNIQUE: Multidetector CT imaging of the chest was performed using the standard protocol during bolus administration of intravenous contrast. Multiplanar CT image reconstructions and MIPs were obtained to evaluate the vascular anatomy. CONTRAST:  16mL OMNIPAQUE IOHEXOL 350 MG/ML SOLN COMPARISON:  Abdominal CT 09/12/2019 FINDINGS: Cardiovascular: Normal heart size. No pericardial effusion. Atheromatous calcification  along the aorta and left main coronary. Mediastinum/Nodes: Mildly enlarged bronchial lymph nodes the left lower lobe, 11 mm in diameter. Lungs/Pleura: Ovoid area of decreased enhancement and vessel attenuation measuring up to 5.7 cm on axial slices. There is adjacent enhancing compressed appearing lung. Small left pleural effusion with some scalloping. Upper Abdomen: Negative Musculoskeletal: Negative for bone erosion. No evidence of osseous metastatic disease.  Review of the MIP images confirms the above findings. IMPRESSION: 1. Concern for left lower lobe mass with bronchial adenopathy. Recommend referral to multi disciplinary thoracic oncology. Findings are not definitive for malignancy and could be related to round atelectasis or atypical pneumonia, consider antibiotic therapy in the meantime. 2. Small left pleural effusion complicated by areas of loculation. 3. Negative for pulmonary embolism. Electronically Signed   By: Monte Fantasia M.D.   On: 09/19/2019 06:44   [place] CHEST (PLEURAL EFFUSION)  Result Date: 09/23/2019 CLINICAL DATA:  Concern for worsening left-sided pleural effusion. EXAM: CHEST ULTRASOUND COMPARISON:  Chest CT-09/19/2019; chest radiograph-09/22/2019 FINDINGS: Sonographic evaluation of the left hemithorax demonstrates apparent dense consolidation of the left lung (favored) versus densely loculated pleural fluid, not amenable to ultrasound-guided thoracentesis. No thoracentesis attempted. IMPRESSION: Dense consolidation of the left lung (favored) versus densely loculated pleural fluid, not amenable to ultrasound-guided thoracentesis. No thoracentesis attempted. Recommend proceeding with repeat contrast-enhanced chest CT for further evaluation. Critical Value/emergent results were relayed to referring physician via epic messenger at the time of interpretation on 09/23/2019 at 10:37 am to provider [facility] , who acknowledged these results. Electronically Signed   By: Sandi Mariscal M.D.   On: 09/23/2019 10:38   US Venous Img Lower Bilateral (DVT)  Result Date: 09/19/2019 CLINICAL DATA:  Elevated D-dimer.  Evaluate for DVT. EXAM: BILATERAL LOWER EXTREMITY VENOUS DOPPLER ULTRASOUND TECHNIQUE: Gray-scale sonography with graded compression, as well as color Doppler and duplex ultrasound were performed to evaluate the lower extremity deep venous systems from the level of the common femoral vein and including the common femoral, femoral, profunda  femoral, popliteal and calf veins including the posterior tibial, peroneal and gastrocnemius veins when visible. The superficial great saphenous vein was also interrogated. Spectral Doppler was utilized to evaluate flow at rest and with distal augmentation maneuvers in the common femoral, femoral and popliteal veins. COMPARISON:  None. FINDINGS: RIGHT LOWER EXTREMITY Common Femoral Vein: No evidence of thrombus. Normal compressibility, respiratory phasicity and response to augmentation. Saphenofemoral Junction: No evidence of thrombus. Normal compressibility and flow on color Doppler imaging. Profunda Femoral Vein: No evidence of thrombus. Normal compressibility and flow on color Doppler imaging. Femoral Vein: No evidence of thrombus. Normal compressibility, respiratory phasicity and response to augmentation. Popliteal Vein: No evidence of thrombus. Normal compressibility, respiratory phasicity and response to augmentation. Calf Veins: No evidence of thrombus. Normal compressibility and flow on color Doppler imaging. Superficial Great Saphenous Vein: No evidence of thrombus. Normal compressibility. Venous Reflux:  None. Other Findings:  None. LEFT LOWER EXTREMITY Common Femoral Vein: No evidence of thrombus. Normal compressibility, respiratory phasicity and response to augmentation. Saphenofemoral Junction: No evidence of thrombus. Normal compressibility and flow on color Doppler imaging. Profunda Femoral Vein: No evidence of thrombus. Normal compressibility and flow on color Doppler imaging. Femoral Vein: No evidence of thrombus. Normal compressibility, respiratory phasicity and response to augmentation. Popliteal Vein: No evidence of thrombus. Normal compressibility, respiratory phasicity and response to augmentation. Calf Veins: No evidence of thrombus. Normal compressibility and flow on color Doppler imaging. Superficial Great Saphenous Vein: No evidence of thrombus. Normal compressibility.  Venous Reflux:  None.  Other Findings:  None. IMPRESSION: No evidence of DVT within either lower extremity. Electronically Signed   By: Sandi Mariscal M.D.   On: 09/19/2019 12:29   DG Chest Port 1 View  Result Date: 09/22/2019 CLINICAL DATA:  Pleural effusion. EXAM: PORTABLE CHEST 1 VIEW COMPARISON:  Chest x-ray dated 09/21/2019. FINDINGS: Near complete opacification of the LEFT hemithorax, stable to slightly increased compared to yesterday's chest x-ray. Probable small RIGHT pleural effusion and/or RIGHT basilar atelectasis. RIGHT lung remains otherwise clear. No pneumothorax is seen. Cardiomediastinal size and position is grossly stable. IMPRESSION: 1. Near complete opacification of the LEFT hemithorax, stable to slightly increased compared to yesterday's chest x-ray, significantly increased compared to the chest CT angiogram of 09/19/2019. This is presumably a combination of large pleural effusion and LEFT lower lung pneumonia versus atelectasis. 2. Probable small pleural effusion and/or atelectasis at the RIGHT lung base. Electronically Signed   By: Franki Cabot M.D.   On: 09/22/2019 12:09   ECHOCARDIOGRAM COMPLETE  Result Date: 09/22/2019    ECHOCARDIOGRAM REPORT   Patient Name:   Alan Wolf Date of Exam: 09/22/2019 Medical Rec #:  [id number]        Height:       71.0 in Accession #:    [id number]       Weight:       151.2 lb Date of Birth:  [birth year]        BSA:          1.872 m Patient Age:    75 years         BP:           120/69 mmHg Patient Gender: M                HR:           94 bpm. Exam Location:  [facility] Procedure: 2D Echo Indications:     CHF-Acute Diastolic 308.65/H84.69  History:         Patient has no prior history of Echocardiogram examinations.  Sonographer:     Avanell Shackleton Referring Phys:  [id number] Sidney Ace Diagnosing Phys: Serafina Royals MD IMPRESSIONS  1. Left ventricular ejection fraction, by estimation, is 65 to 70%. The left ventricle has normal function. The left ventricle has no regional wall  motion abnormalities. Left ventricular diastolic parameters were normal.  2. Right ventricular systolic function is normal. The right ventricular size is normal. There is moderately elevated pulmonary artery systolic pressure.  3. The mitral valve is normal in structure. Trivial mitral valve regurgitation.  4. The aortic valve is normal in structure. Aortic valve regurgitation is trivial. FINDINGS  Left Ventricle: Left ventricular ejection fraction, by estimation, is 65 to 70%. The left ventricle has normal function. The left ventricle has no regional wall motion abnormalities. The left ventricular internal cavity size was small. There is no left ventricular hypertrophy. Left ventricular diastolic parameters were normal. Right Ventricle: The right ventricular size is normal. No increase in right ventricular wall thickness. Right ventricular systolic function is normal. There is moderately elevated pulmonary artery systolic pressure. The tricuspid regurgitant velocity is 3.16 m/s, and with an assumed right atrial pressure of 10 mmHg, the estimated right ventricular systolic pressure is 13.2 mmHg. Left Atrium: Left atrial size was normal in size. Right Atrium: Right atrial size was normal in size. Pericardium: There is no evidence of pericardial effusion. Mitral Valve: The mitral valve is normal in structure. Trivial mitral valve  regurgitation. Tricuspid Valve: The tricuspid valve is normal in structure. Tricuspid valve regurgitation is not demonstrated. Aortic Valve: The aortic valve is normal in structure. Aortic valve regurgitation is trivial. Pulmonic Valve: The pulmonic valve was normal in structure. Pulmonic valve regurgitation is not visualized. Aorta: The aortic root and ascending aorta are structurally normal, with no evidence of dilitation. IAS/Shunts: No atrial level shunt detected by color flow Doppler.  LEFT VENTRICLE PLAX 2D LVIDd:         4.26 cm  Diastology LVIDs:         2.64 cm  LV e' lateral:   8.05  cm/s LV PW:         0.68 cm  LV E/e' lateral: 8.7 LV IVS:        0.71 cm  LV e' medial:    8.05 cm/s LVOT diam:     2.00 cm  LV E/e' medial:  8.7 LVOT Area:     3.14 cm  RIGHT VENTRICLE             IVC RV S prime:     19.70 cm/s  IVC diam: 2.32 cm TAPSE (M-mode): 2.8 cm LEFT ATRIUM             Index       RIGHT ATRIUM           Index LA diam:        2.80 cm 1.50 cm/m  RA Area:     16.00 cm LA Vol (A2C):   35.9 ml 19.17 ml/m RA Volume:   41.20 ml  22.00 ml/m LA Vol (A4C):   35.0 ml 18.69 ml/m LA Biplane Vol: 35.1 ml 18.75 ml/m   AORTA Ao Root diam: 3.00 cm MITRAL VALVE               TRICUSPID VALVE MV Area (PHT): 2.39 cm    TR Peak grad:   39.9 mmHg MV Decel Time: 318 msec    TR Vmax:        316.00 cm/s MV E velocity: 70.00 cm/s MV A velocity: 93.00 cm/s  SHUNTS MV E/A ratio:  0.75        Systemic Diam: 2.00 cm Serafina Royals MD Electronically signed by Serafina Royals MD Signature Date/Time: 09/22/2019/7:30:23 PM    Final    CT RENAL STONE STUDY  Result Date: 09/12/2019 CLINICAL DATA:  LEFT flank and LEFT lower quadrant pain for 10-12 days, no history of kidney stones or surgery EXAM: CT ABDOMEN AND PELVIS WITHOUT CONTRAST TECHNIQUE: Multidetector CT imaging of the abdomen and pelvis was performed following the standard protocol without IV contrast. Sagittal and coronal MPR images reconstructed from axial data set. No oral contrast. COMPARISON:  None FINDINGS: Lower chest: Small LEFT pleural effusion with partial atelectasis of LEFT lower lobe. Hepatobiliary: Gallbladder and liver normal appearance Pancreas: Normal appearance Spleen: Normal appearance Adrenals/Urinary Tract: Adrenal glands, kidneys, ureters, and bladder normal appearance per Stomach/Bowel: Normal appendix. Minimally prominent stool in rectum. Large and small bowel loops otherwise unremarkable. No definite gastric abnormalities. Vascular/Lymphatic: Atherosclerotic calcifications aorta and iliac arteries without aneurysm. No adenopathy.  Reproductive: Prostatic enlargement, gland measuring 4.7 x 4.3 cm image 72. Other: No free air or free fluid. No inflammatory process. Small RIGHT inguinal hernia containing fat. Musculoskeletal: Small lucent focus within posterior aspect of L4 vertebral body RIGHT of midline, nonspecific. No additional osseous abnormalities. IMPRESSION: Small LEFT pleural effusion with partial atelectasis of LEFT lower lobe. Small RIGHT inguinal hernia containing fat.  Prostatic enlargement. No acute intra-abdominal or intrapelvic abnormalities. Small nonspecific lucent focus within the posterior aspect of the L4 vertebral body, nonspecific, cannot exclude myeloma or potentially lytic metastasis; correlation with workup for myeloma recommended. Aortic Atherosclerosis (ICD10-I70.0). Electronically Signed   By: Lavonia Dana M.D.   On: 09/12/2019 11:00    Labs:  CBC: Recent Labs    09/20/19 0452 09/21/19 0419 09/22/19 0336 09/23/19 0422  WBC 24.2* 26.9* 21.8* 16.7*  HGB 14.1 12.9* 12.8* 12.7*  HCT 42.5 36.8* 38.0* 38.4*  PLT 292 238 248 282    COAGS: Recent Labs    09/19/19 1224  INR 1.1  APTT 36    BMP: Recent Labs    09/20/19 0452 09/21/19 0419 09/22/19 0336 09/23/19 0422  NA 135 136 137 135  K 4.0 4.2 3.6 3.4*  CL 98 102 98 95*  CO2 26 25 29 29   GLUCOSE 165* 243* 224* 258*  BUN 19 18 19 22   CALCIUM 8.8* 8.3* 8.4* 8.4*  CREATININE 1.13 1.24 1.32* 1.49*  GFRNONAA >60 60* 55* 48*  GFRAA >60 >60 >60 55*    LIVER FUNCTION TESTS: Recent Labs    09/19/19 0518  BILITOT 1.0  AST 22  ALT 28  ALKPHOS 110  PROT 8.2*  ALBUMIN 3.7    TUMOR MARKERS: No results for input(s): AFPTM, CEA, CA199, CHROMGRNA in the last 8760 hours.  Assessment and Plan:  Alan Wolf is a 69 y.o. male with no significant past medical history admitted to [facility] with worsening shortness of breath found to have a progressive left-sided pleural effusion.  Patient underwent attempted  ultrasound-guided thoracentesis today however thoracentesis was not performed given marked complexity of left-sided pleural fluid.  Patient has been evaluated by Dr. Genevive Bi of thoracic surgery and request has been made for image guided placement of a chest tube for infectious source control prior to potential definitive operative intervention with VATS.  Patient continues to complain of shortness of breath.  Risks and benefits of image guided left-sided chest tube placement was discussed with the patient including bleeding, infection, damage to adjacent structures, bowel perforation/fistula connection, and sepsis.  All of the patient's questions were answered, patient is agreeable to proceed. Consent signed and in chart.  Thank you for this interesting consult.  I greatly enjoyed meeting Alan Wolf and look forward to participating in their care.  A copy of this report was sent to the requesting provider on this date.  Electronically Signed: Sandi Mariscal, MD 09/23/2019, 4:53 PM   I spent a total of 20 Minutes in face to face in clinical consultation, greater than 50% of which was counseling/coordinating care for CT-guided left-sided chest tube placement

## 2019-09-24 ENCOUNTER — Inpatient Hospital Stay: Payer: Medicare HMO

## 2019-09-24 DIAGNOSIS — J90 Pleural effusion, not elsewhere classified: Secondary | ICD-10-CM

## 2019-09-24 LAB — BASIC METABOLIC PANEL
Anion gap: 10 (ref 5–15)
BUN: 28 mg/dL — ABNORMAL HIGH (ref 8–23)
CO2: 30 mmol/L (ref 22–32)
Calcium: 8.5 mg/dL — ABNORMAL LOW (ref 8.9–10.3)
Chloride: 97 mmol/L — ABNORMAL LOW (ref 98–111)
Creatinine, Ser: 1.47 mg/dL — ABNORMAL HIGH (ref 0.61–1.24)
GFR calc Af Amer: 56 mL/min — ABNORMAL LOW (ref >60)
GFR calc non Af Amer: 49 mL/min — ABNORMAL LOW (ref >60)
Glucose, Bld: 194 mg/dL — ABNORMAL HIGH (ref 70–99)
Potassium: 3.5 mmol/L (ref 3.5–5.1)
Sodium: 137 mmol/L (ref 135–145)

## 2019-09-24 LAB — MULTIPLE MYELOMA PANEL, SERUM
Albumin SerPl Elph-Mcnc: 2.9 g/dL (ref 2.9–4.4)
Albumin/Glob SerPl: 0.9 (ref 0.7–1.7)
Alpha 1: 0.5 g/dL — ABNORMAL HIGH (ref 0.0–0.4)
Alpha2 Glob SerPl Elph-Mcnc: 0.8 g/dL (ref 0.4–1.0)
B-Globulin SerPl Elph-Mcnc: 1.1 g/dL (ref 0.7–1.3)
Gamma Glob SerPl Elph-Mcnc: 1.2 g/dL (ref 0.4–1.8)
Globulin, Total: 3.5 g/dL (ref 2.2–3.9)
IgA: 331 mg/dL (ref 61–437)
IgG (Immunoglobin G), Serum: 1288 mg/dL (ref 603–1613)
IgM (Immunoglobulin M), Srm: 27 mg/dL (ref 20–172)
Total Protein ELP: 6.4 g/dL (ref 6.0–8.5)

## 2019-09-24 LAB — CULTURE, BLOOD (ROUTINE X 2)
Culture: NO GROWTH
Culture: NO GROWTH
Special Requests: ADEQUATE
Special Requests: ADEQUATE

## 2019-09-24 LAB — VANCOMYCIN, RANDOM: Vancomycin Rm: 4

## 2019-09-24 MED ORDER — SODIUM CHLORIDE (PF) 0.9 % IJ SOLN
Freq: Once | INTRAMUSCULAR | Status: DC
  Filled 2019-09-24: qty 10

## 2019-09-24 NOTE — Progress Notes (Signed)
Brief Progress Note  Patient seen and examined. CXR reviewed which appears improved after placement of percutaneous chest tube yesterday with IR. Had about 200 ccs of serous fluid. Breathing improved. Chest tube site is CDI, no air leak.   After disconnecting chest tube and and cleaning with alcohol wipes, I instilled 50 ml of tPA in NS intrapleurally via chest tube. I then placed chest tube lure lock in "off" position and clamped chest tube which I removed from suction. He tolerated this well without immediate complication.   Plan: -- Leave chest tube off suction for 4 hours prior to reconnecting -- encouraged ambulation, pulmonary toileting -- Repeat CXR in the morning  Above plane reviewed with Dr Genevive Bi.   -- Edison Simon, PA-C [facility] 09/24/2019, 12:20 PM [phone] M-F: 7am - 4pm

## 2019-09-24 NOTE — Progress Notes (Addendum)
Pharmacy Antibiotic Note  Alan Wolf is a 69 y.o. male admitted on 09/19/2019 with sepsis due to PNA. Status post CT-guided chest tube placement with plans for intra-pleural TPA by Dr Genevive Bi.  Pharmacy was consulted for vancomycin dosing. This is day # 5 of broad-spectrum antibiotics. Since admission his renal function has had gradually worsened up until 8/2 and now appears to be improving A random vancomycin level was drawn prior to the vancomycin dose change 8/3 which is shown below:  08/03 1415 vancomycin random level: 4 mcg/mL  Plan:  1) vancomycin:  Adjust vancomycin dose to 1250 mg IV every 24 hours  Next dose 08/05 0000 given low level prior to the first 1000 mg dose following the level  Ke: 0.055h-1, T1/2: 12.6 h  Css (calculated): 34.5/10 mcg/mL  SCr in am to assess renal function  2) Zosyn  Continue Zosyn 3.375 grams IV EI every 8 hours  Height: 5\' 11"  (180.3 cm) Weight: 68.6 kg (151 lb 3.8 oz) IBW/kg (Calculated) : 75.3  Temp (24hrs), Avg:99.4 F (37.4 C), Min:98.3 F (36.8 C), Max:101 F (38.3 C)  Recent Labs  Lab 09/19/19 0518 09/20/19 0452 09/20/19 1455 09/20/19 1703 09/21/19 0419 09/22/19 0336 09/23/19 0422  WBC 11.7* 24.2*  --   --  26.9* 21.8* 16.7*  CREATININE 1.26* 1.13  --   --  1.24 1.32* 1.49*  LATICACIDVEN  --   --  2.3* 1.8  --   --   --     Estimated Creatinine Clearance: 46.7 mL/min (A) (by C-G formula based on SCr of 1.49 mg/dL (H)).    Allergies  Allergen Reactions  . Penicillins     Per pt - when a child, it made the fever go up instead of down    Antimicrobials this admission: azithromycin 7/29  >> 8/2 Zosyn 7/30  >>  vancomycin 7/30 >>  Microbiology results: 7/29 BCx: NG final 7/29 SARS CoV-2: negative 7/30 Sputum: rare GPC pairs, GPR  7/31 MRSA PCR: negative  Thank you for allowing pharmacy to be a part of this patient's care.  Dallie Piles 09/24/2019 6:58 AM

## 2019-09-24 NOTE — Progress Notes (Signed)
Pulmonary Medicine          Date: 09/24/2019,   MRN# [id number] Tommaso Cavitt Dec 18, 1950     AdmissionWeight: 70.3 kg                 CurrentWeight: 68.6 kg  Referring physician: Dr Blaine Hamper    CHIEF COMPLAINT:   Lung Mass of left lowe lobe with hilar lymphadenopathy.    SUBJECTIVE   Patient is walking around room. He feels better and is smiling. Has active drainage from chest tube.  No air leak on forced expiratory maneuver.  Reviewed CXR this am improved LUL.  Plan for intrapleural thrombolysis by CT surgery team. Discussed care plan with attending physician Dr [name] today. Reviewed BPH with RT this morning.   PAST MEDICAL HISTORY   History reviewed. No pertinent past medical history.   SURGICAL HISTORY   History reviewed. No pertinent surgical history.   FAMILY HISTORY   Family History  Problem Relation Age of Onset  . Diabetes Mellitus II Mother   . Lung cancer Brother      SOCIAL HISTORY   Social History   Tobacco Use  . Smoking status: Never Smoker  . Smokeless tobacco: Never Used  Substance Use Topics  . Alcohol use: Never  . Drug use: Never     MEDICATIONS    Home Medication:    Current Medication:  Current Facility-Administered Medications:  .  0.9 %  sodium chloride infusion, , Intravenous, PRN, Sidney Ace, MD, Paused at 09/24/19 0145 .  acetaminophen (TYLENOL) tablet 650 mg, 650 mg, Oral, Q6H PRN, [name] B, MD, 650 mg at 09/24/19 0544 .  acetylcysteine (MUCOMYST) 20 % nebulizer / oral solution 4 mL, 4 mL, Nebulization, BID, Lanney Gins, Trinidee Schrag, MD, 4 mL at 09/24/19 0745 .  albuterol (PROVENTIL) (2.5 MG/3ML) 0.083% nebulizer solution 2.5 mg, 2.5 mg, Nebulization, Q4H PRN, [name], MD, 2.5 mg at 09/24/19 0745 .  azithromycin (ZITHROMAX) 500 mg in sodium chloride 0.9 % 250 mL IVPB, 500 mg, Intravenous, Q24H, [name], MD, Paused at 09/23/19 1814 .  benzonatate (TESSALON) capsule 200 mg, 200 mg, Oral, TID,  [name], Sudheer B, MD, 200 mg at 09/23/19 2055 .  diphenhydrAMINE (BENADRYL) capsule 25 mg, 25 mg, Oral, Q6H PRN, Sreenath, Sudheer B, MD .  gabapentin (NEURONTIN) tablet 300 mg, 300 mg, Oral, TID, [name], Sudheer B, MD, 300 mg at 09/23/19 2055 .  ketorolac (TORADOL) 15 MG/ML injection 15 mg, 15 mg, Intravenous, Q6H, Sreenath, Sudheer B, MD, 15 mg at 09/24/19 0544 .  lidocaine (LIDODERM) 5 % 1 patch, 1 patch, Transdermal, Q24H, Sreenath, Sudheer B, MD, 1 patch at 09/23/19 1735 .  MEDLINE mouth rinse, 15 mL, Mouth Rinse, BID, Sreenath, Sudheer B, MD, 15 mL at 09/23/19 1000 .  morphine 2 MG/ML injection 2 mg, 2 mg, Intravenous, Q3H PRN, [name], Sudheer B, MD, 2 mg at 09/23/19 1052 .  ondansetron (ZOFRAN) injection 4 mg, 4 mg, Intravenous, Q8H PRN, [name], MD .  oxyCODONE-acetaminophen (PERCOCET/ROXICET) 5-325 MG per tablet 1 tablet, 1 tablet, Oral, Q4H PRN, [name], MD, 1 tablet at 09/19/19 1625 .  piperacillin-tazobactam (ZOSYN) IVPB 3.375 g, 3.375 g, Intravenous, Q8H, Shanlever, Pierce Crane, RPH, Last Rate: 12.5 mL/hr at 09/24/19 0547, 3.375 g at 09/24/19 0547 .  promethazine-codeine (PHENERGAN with CODEINE) 6.25-10 MG/5ML syrup 5 mL, 5 mL, Oral, Q6H PRN, Sreenath, Sudheer B, MD .  vancomycin (VANCOCIN) IVPB 1000 mg/200 mL premix, 1,000 mg, Intravenous, Q24H, Dallie Piles,  RPH    ALLERGIES   Penicillins     REVIEW OF SYSTEMS    Review of Systems:  Gen:  Denies  fever, sweats, chills weigh loss  HEENT: Denies blurred vision, double vision, ear pain, eye pain, hearing loss, nose bleeds, sore throat Cardiac:  No dizziness, chest pain or heaviness, chest tightness,edema Resp:   Denies cough or sputum porduction, shortness of breath,wheezing, hemoptysis,  Gi: Denies swallowing difficulty, stomach pain, nausea or vomiting, diarrhea, constipation, bowel incontinence Gu:  Denies bladder incontinence, burning urine Ext:   Denies Joint pain, stiffness or swelling Skin: Denies   skin rash, easy bruising or bleeding or hives Endoc:  Denies polyuria, polydipsia , polyphagia or weight change Psych:   Denies depression, insomnia or hallucinations   Other:  All other systems negative   VS: BP 139/64 (BP Location: Left Arm)   Pulse 94   Temp 100 F (37.8 C) (Oral)   Resp 18   Ht 5\' 11"  (1.803 m)   Wt 68.6 kg   SpO2 91%   BMI 21.09 kg/m      PHYSICAL EXAM    GENERAL:NAD, no fevers, chills, no weakness no fatigue HEAD: Normocephalic, atraumatic.  EYES: Pupils equal, round, reactive to light. Extraocular muscles intact. No scleral icterus.  MOUTH: Moist mucosal membrane. Dentition intact. No abscess noted.  EAR, NOSE, THROAT: Clear without exudates. No external lesions.  NECK: Supple. No thyromegaly. No nodules. No JVD.  PULMONARY: Diffuse coarse rhonchi right sided +wheezes CARDIOVASCULAR: S1 and S2. Regular rate and rhythm. No murmurs, rubs, or gallops. No edema. Pedal pulses 2+ bilaterally.  GASTROINTESTINAL: Soft, nontender, nondistended. No masses. Positive bowel sounds. No hepatosplenomegaly.  MUSCULOSKELETAL: No swelling, clubbing, or edema. Range of motion full in all extremities.  NEUROLOGIC: Cranial nerves II through XII are intact. No gross focal neurological deficits. Sensation intact. Reflexes intact.  SKIN: No ulceration, lesions, rashes, or cyanosis. Skin warm and dry. Turgor intact.  PSYCHIATRIC: Mood, affect within normal limits. The patient is awake, alert and oriented x 3. Insight, judgment intact.       IMAGING    DG Chest 1 View  Result Date: 09/21/2019 CLINICAL DATA:  69 year old with progressive shortness of breath with exertion over night and acute respiratory failure. Fever and cough. EXAM: Portable CHEST 1 VIEW COMPARISON:  CTA chest 09/19/2019. FINDINGS: Since the CT 2 days ago, marked increase in size of the now very large LEFT pleural effusion. Worsening dense consolidation in the LEFT LOWER LOBE and lingula. New small RIGHT  pleural effusion and associated mild passive atelectasis in the RIGHT LOWER LOBE. RIGHT lung remains clear otherwise. Heart moderately enlarged. Pulmonary vascularity normal. IMPRESSION: 1. Marked increase in size of the now large LEFT pleural effusion since the CT 2 days ago. 2. Worsening dense atelectasis and/or pneumonia in the LEFT LOWER LOBE and lingula. 3. New small RIGHT pleural effusion and associated mild passive atelectasis in the RIGHT LOWER LOBE. Electronically Signed   By: Evangeline Dakin M.D.   On: 09/21/2019 12:49   CT CHEST W CONTRAST  Result Date: 09/23/2019 CLINICAL DATA:  69 year old male with abnormal x-ray. Pleural effusion. EXAM: CT CHEST WITH CONTRAST TECHNIQUE: Multidetector CT imaging of the chest was performed during intravenous contrast administration. CONTRAST:  72mL OMNIPAQUE IOHEXOL 300 MG/ML  SOLN COMPARISON:  Chest CT dated 09/19/2019 and radiograph dated 09/22/2019. FINDINGS: Cardiovascular: There is no cardiomegaly or pericardial effusion. Coronary vascular calcification primarily involving the LAD. The thoracic aorta is unremarkable. The origins of the  great vessels of the aortic arch are patent as visualized. Evaluation of the pulmonary arteries is limited due to suboptimal visualization of the distal branches. No large or central pulmonary artery embolus identified. Mediastinum/Nodes: Mildly enlarged right hilar lymph node measures 12 mm in short axis. Subcarinal lymph node measures 11 mm. The esophagus is grossly unremarkable. No mediastinal fluid collection. Lungs/Pleura: There is a large left pleural effusion with probable areas of loculation. There is compressive atelectasis of the majority of the left lung with a small aerated portion of the left upper lobe. Small pockets of air within the effusion inferiorly may be related to recent procedure or represent an infectious process. A bronchopleural fistula is not excluded. There is a small right pleural effusion. Partial  consolidative changes involving the right lung base and right middle lobe may represent atelectasis or infiltrate. Smaller ground-glass clusters in the right upper lobe may represent pneumonia. There is no pneumothorax. The central airways are patent. Upper Abdomen: No acute abnormality. Musculoskeletal: No chest wall abnormality. No acute or significant osseous findings. IMPRESSION: 1. No central pulmonary artery embolus identified. 2. Large left pleural effusion with consolidation of the majority of the left lung. Small pockets of air within the effusion inferiorly may be related to recent procedure or represent an infectious process. A bronchopleural fistula is not excluded. 3. Small right pleural effusion. 4. Partial consolidative changes involving the right lung base and right middle lobe may represent atelectasis or infiltrate. Smaller ground-glass clusters in the right upper lobe may represent pneumonia. Clinical correlation and follow-up recommended. 5. Mildly enlarged right hilar and subcarinal lymph nodes, likely reactive. 6. Aortic Atherosclerosis (ICD10-I70.0). Electronically Signed   By: Anner Crete M.D.   On: 09/23/2019 15:46   CT Angio Chest PE W and/or Wo Contrast  Result Date: 09/19/2019 CLINICAL DATA:  Chest pain with pleurisy or effusion suspected EXAM: CT ANGIOGRAPHY CHEST WITH CONTRAST TECHNIQUE: Multidetector CT imaging of the chest was performed using the standard protocol during bolus administration of intravenous contrast. Multiplanar CT image reconstructions and MIPs were obtained to evaluate the vascular anatomy. CONTRAST:  77mL OMNIPAQUE IOHEXOL 350 MG/ML SOLN COMPARISON:  Abdominal CT 09/12/2019 FINDINGS: Cardiovascular: Normal heart size. No pericardial effusion. Atheromatous calcification along the aorta and left main coronary. Mediastinum/Nodes: Mildly enlarged bronchial lymph nodes the left lower lobe, 11 mm in diameter. Lungs/Pleura: Ovoid area of decreased enhancement and  vessel attenuation measuring up to 5.7 cm on axial slices. There is adjacent enhancing compressed appearing lung. Small left pleural effusion with some scalloping. Upper Abdomen: Negative Musculoskeletal: Negative for bone erosion. No evidence of osseous metastatic disease. Review of the MIP images confirms the above findings. IMPRESSION: 1. Concern for left lower lobe mass with bronchial adenopathy. Recommend referral to multi disciplinary thoracic oncology. Findings are not definitive for malignancy and could be related to round atelectasis or atypical pneumonia, consider antibiotic therapy in the meantime. 2. Small left pleural effusion complicated by areas of loculation. 3. Negative for pulmonary embolism. Electronically Signed   By: Monte Fantasia M.D.   On: 09/19/2019 06:44   [place] CHEST (PLEURAL EFFUSION)  Result Date: 09/23/2019 CLINICAL DATA:  Concern for worsening left-sided pleural effusion. EXAM: CHEST ULTRASOUND COMPARISON:  Chest CT-09/19/2019; chest radiograph-09/22/2019 FINDINGS: Sonographic evaluation of the left hemithorax demonstrates apparent dense consolidation of the left lung (favored) versus densely loculated pleural fluid, not amenable to ultrasound-guided thoracentesis. No thoracentesis attempted. IMPRESSION: Dense consolidation of the left lung (favored) versus densely loculated pleural fluid, not amenable to ultrasound-guided  thoracentesis. No thoracentesis attempted. Recommend proceeding with repeat contrast-enhanced chest CT for further evaluation. Critical Value/emergent results were relayed to referring physician via epic messenger at the time of interpretation on 09/23/2019 at 10:37 am to provider [facility] , who acknowledged these results. Electronically Signed   By: Sandi Mariscal M.D.   On: 09/23/2019 10:38   US Venous Img Lower Bilateral (DVT)  Result Date: 09/19/2019 CLINICAL DATA:  Elevated D-dimer.  Evaluate for DVT. EXAM: BILATERAL LOWER EXTREMITY VENOUS DOPPLER  ULTRASOUND TECHNIQUE: Gray-scale sonography with graded compression, as well as color Doppler and duplex ultrasound were performed to evaluate the lower extremity deep venous systems from the level of the common femoral vein and including the common femoral, femoral, profunda femoral, popliteal and calf veins including the posterior tibial, peroneal and gastrocnemius veins when visible. The superficial great saphenous vein was also interrogated. Spectral Doppler was utilized to evaluate flow at rest and with distal augmentation maneuvers in the common femoral, femoral and popliteal veins. COMPARISON:  None. FINDINGS: RIGHT LOWER EXTREMITY Common Femoral Vein: No evidence of thrombus. Normal compressibility, respiratory phasicity and response to augmentation. Saphenofemoral Junction: No evidence of thrombus. Normal compressibility and flow on color Doppler imaging. Profunda Femoral Vein: No evidence of thrombus. Normal compressibility and flow on color Doppler imaging. Femoral Vein: No evidence of thrombus. Normal compressibility, respiratory phasicity and response to augmentation. Popliteal Vein: No evidence of thrombus. Normal compressibility, respiratory phasicity and response to augmentation. Calf Veins: No evidence of thrombus. Normal compressibility and flow on color Doppler imaging. Superficial Great Saphenous Vein: No evidence of thrombus. Normal compressibility. Venous Reflux:  None. Other Findings:  None. LEFT LOWER EXTREMITY Common Femoral Vein: No evidence of thrombus. Normal compressibility, respiratory phasicity and response to augmentation. Saphenofemoral Junction: No evidence of thrombus. Normal compressibility and flow on color Doppler imaging. Profunda Femoral Vein: No evidence of thrombus. Normal compressibility and flow on color Doppler imaging. Femoral Vein: No evidence of thrombus. Normal compressibility, respiratory phasicity and response to augmentation. Popliteal Vein: No evidence of  thrombus. Normal compressibility, respiratory phasicity and response to augmentation. Calf Veins: No evidence of thrombus. Normal compressibility and flow on color Doppler imaging. Superficial Great Saphenous Vein: No evidence of thrombus. Normal compressibility. Venous Reflux:  None. Other Findings:  None. IMPRESSION: No evidence of DVT within either lower extremity. Electronically Signed   By: Sandi Mariscal M.D.   On: 09/19/2019 12:29   DG Chest Port 1 View  Result Date: 09/24/2019 CLINICAL DATA:  Chest tube placement EXAM: PORTABLE CHEST 1 VIEW COMPARISON:  09/22/2019 FINDINGS: A left chest tube has been place with evacuation of much of the left pleural effusion. A moderate-sized effusion remains. Residual consolidation or atelectasis in the left lung. Linear atelectasis in the right lung base. Heart size is partly obscured but appears normal. IMPRESSION: Interval placement of left chest tube with evacuation of much of the left pleural effusion. Residual consolidation or atelectasis in the left lung. Electronically Signed   By: Lucienne Capers M.D.   On: 09/24/2019 03:24   DG Chest Port 1 View  Result Date: 09/22/2019 CLINICAL DATA:  Pleural effusion. EXAM: PORTABLE CHEST 1 VIEW COMPARISON:  Chest x-ray dated 09/21/2019. FINDINGS: Near complete opacification of the LEFT hemithorax, stable to slightly increased compared to yesterday's chest x-ray. Probable small RIGHT pleural effusion and/or RIGHT basilar atelectasis. RIGHT lung remains otherwise clear. No pneumothorax is seen. Cardiomediastinal size and position is grossly stable. IMPRESSION: 1. Near complete opacification of the LEFT hemithorax, stable to  slightly increased compared to yesterday's chest x-ray, significantly increased compared to the chest CT angiogram of 09/19/2019. This is presumably a combination of large pleural effusion and LEFT lower lung pneumonia versus atelectasis. 2. Probable small pleural effusion and/or atelectasis at the RIGHT  lung base. Electronically Signed   By: Franki Cabot M.D.   On: 09/22/2019 12:09   ECHOCARDIOGRAM COMPLETE  Result Date: 09/22/2019    ECHOCARDIOGRAM REPORT   Patient Name:   LATHEN SEAL Date of Exam: 09/22/2019 Medical Rec #:  [id number]        Height:       71.0 in Accession #:    [id number]       Weight:       151.2 lb Date of Birth:  02/29/1950        BSA:          1.872 m Patient Age:    38 years         BP:           120/69 mmHg Patient Gender: M                HR:           94 bpm. Exam Location:  [facility] Procedure: 2D Echo Indications:     CHF-Acute Diastolic 811.03/P59.45  History:         Patient has no prior history of Echocardiogram examinations.  Sonographer:     Avanell Shackleton Referring Phys:  [id number] Sidney Ace Diagnosing Phys: Serafina Royals MD IMPRESSIONS  1. Left ventricular ejection fraction, by estimation, is 65 to 70%. The left ventricle has normal function. The left ventricle has no regional wall motion abnormalities. Left ventricular diastolic parameters were normal.  2. Right ventricular systolic function is normal. The right ventricular size is normal. There is moderately elevated pulmonary artery systolic pressure.  3. The mitral valve is normal in structure. Trivial mitral valve regurgitation.  4. The aortic valve is normal in structure. Aortic valve regurgitation is trivial. FINDINGS  Left Ventricle: Left ventricular ejection fraction, by estimation, is 65 to 70%. The left ventricle has normal function. The left ventricle has no regional wall motion abnormalities. The left ventricular internal cavity size was small. There is no left ventricular hypertrophy. Left ventricular diastolic parameters were normal. Right Ventricle: The right ventricular size is normal. No increase in right ventricular wall thickness. Right ventricular systolic function is normal. There is moderately elevated pulmonary artery systolic pressure. The tricuspid regurgitant velocity is 3.16 m/s, and with an  assumed right atrial pressure of 10 mmHg, the estimated right ventricular systolic pressure is 46.2 mmHg. Left Atrium: Left atrial size was normal in size. Right Atrium: Right atrial size was normal in size. Pericardium: There is no evidence of pericardial effusion. Mitral Valve: The mitral valve is normal in structure. Trivial mitral valve regurgitation. Tricuspid Valve: The tricuspid valve is normal in structure. Tricuspid valve regurgitation is not demonstrated. Aortic Valve: The aortic valve is normal in structure. Aortic valve regurgitation is trivial. Pulmonic Valve: The pulmonic valve was normal in structure. Pulmonic valve regurgitation is not visualized. Aorta: The aortic root and ascending aorta are structurally normal, with no evidence of dilitation. IAS/Shunts: No atrial level shunt detected by color flow Doppler.  LEFT VENTRICLE PLAX 2D LVIDd:         4.26 cm  Diastology LVIDs:         2.64 cm  LV e' lateral:   8.05 cm/s LV PW:  0.68 cm  LV E/e' lateral: 8.7 LV IVS:        0.71 cm  LV e' medial:    8.05 cm/s LVOT diam:     2.00 cm  LV E/e' medial:  8.7 LVOT Area:     3.14 cm  RIGHT VENTRICLE             IVC RV S prime:     19.70 cm/s  IVC diam: 2.32 cm TAPSE (M-mode): 2.8 cm LEFT ATRIUM             Index       RIGHT ATRIUM           Index LA diam:        2.80 cm 1.50 cm/m  RA Area:     16.00 cm LA Vol (A2C):   35.9 ml 19.17 ml/m RA Volume:   41.20 ml  22.00 ml/m LA Vol (A4C):   35.0 ml 18.69 ml/m LA Biplane Vol: 35.1 ml 18.75 ml/m   AORTA Ao Root diam: 3.00 cm MITRAL VALVE               TRICUSPID VALVE MV Area (PHT): 2.39 cm    TR Peak grad:   39.9 mmHg MV Decel Time: 318 msec    TR Vmax:        316.00 cm/s MV E velocity: 70.00 cm/s MV A velocity: 93.00 cm/s  SHUNTS MV E/A ratio:  0.75        Systemic Diam: 2.00 cm Serafina Royals MD Electronically signed by Serafina Royals MD Signature Date/Time: 09/22/2019/7:30:23 PM    Final    CT RENAL STONE STUDY  Result Date: 09/12/2019 CLINICAL  DATA:  LEFT flank and LEFT lower quadrant pain for 10-12 days, no history of kidney stones or surgery EXAM: CT ABDOMEN AND PELVIS WITHOUT CONTRAST TECHNIQUE: Multidetector CT imaging of the abdomen and pelvis was performed following the standard protocol without IV contrast. Sagittal and coronal MPR images reconstructed from axial data set. No oral contrast. COMPARISON:  None FINDINGS: Lower chest: Small LEFT pleural effusion with partial atelectasis of LEFT lower lobe. Hepatobiliary: Gallbladder and liver normal appearance Pancreas: Normal appearance Spleen: Normal appearance Adrenals/Urinary Tract: Adrenal glands, kidneys, ureters, and bladder normal appearance per Stomach/Bowel: Normal appendix. Minimally prominent stool in rectum. Large and small bowel loops otherwise unremarkable. No definite gastric abnormalities. Vascular/Lymphatic: Atherosclerotic calcifications aorta and iliac arteries without aneurysm. No adenopathy. Reproductive: Prostatic enlargement, gland measuring 4.7 x 4.3 cm image 72. Other: No free air or free fluid. No inflammatory process. Small RIGHT inguinal hernia containing fat. Musculoskeletal: Small lucent focus within posterior aspect of L4 vertebral body RIGHT of midline, nonspecific. No additional osseous abnormalities. IMPRESSION: Small LEFT pleural effusion with partial atelectasis of LEFT lower lobe. Small RIGHT inguinal hernia containing fat. Prostatic enlargement. No acute intra-abdominal or intrapelvic abnormalities. Small nonspecific lucent focus within the posterior aspect of the L4 vertebral body, nonspecific, cannot exclude myeloma or potentially lytic metastasis; correlation with workup for myeloma recommended. Aortic Atherosclerosis (ICD10-I70.0). Electronically Signed   By: Lavonia Dana M.D.   On: 09/12/2019 11:00   CT IMAGE GUIDED FLUID DRAIN BY CATHETER  Result Date: 09/23/2019 INDICATION: Complex left-sided pleural effusion. Request made for image guided placement of  left-sided chest tube prior to potential definitive VATS. EXAM: CT IMAGE GUIDED FLUID DRAIN BY CATHETER COMPARISON:  Chest CT-earlier same day; 09/19/2019: Chest ultrasound-09/23/2019 MEDICATIONS: The patient is currently admitted to the hospital and receiving intravenous antibiotics. The antibiotics  were administered within an appropriate time frame prior to the initiation of the procedure. ANESTHESIA/SEDATION: Moderate (conscious) sedation was employed during this procedure. A total of Versed 1 mg and Fentanyl 50 mcg was administered intravenously. Moderate Sedation Time: 25 minutes. The patient's level of consciousness and vital signs were monitored continuously by radiology nursing throughout the procedure under my direct supervision. CONTRAST:  None COMPLICATIONS: None immediate. PROCEDURE: Informed written consent was obtained from the patient after a discussion of the risks, benefits and alternatives to treatment. The patient was placed supine on the CT gantry and a pre procedural CT was performed re-demonstrating the known large partially loculated left-sided pleural effusion. The procedure was planned. A timeout was performed prior to the initiation of the procedure. The skin overlying the inferolateral aspect the left lower chest was prepped and draped in the usual sterile fashion. The overlying soft tissues were anesthetized with 1% lidocaine with epinephrine. Appropriate trajectory was planned with the use of a 22 gauge spinal needle. An 18 gauge trocar needle was advanced into the abscess/fluid collection and a short Amplatz super stiff wire was coiled within the collection. Appropriate positioning was confirmed with a limited CT scan. The tract was serially dilated allowing placement of a 16 French all-purpose drainage catheter. Appropriate positioning was confirmed with a limited postprocedural CT scan. Approximately 650 cc ml of dark yellow fluid was aspirated. The tube was connected to a pleura vac  device and sutured in place. A dressing was placed. The patient tolerated the procedure well without immediate post procedural complication. IMPRESSION: Successful CT guided placement of a 2 French all purpose drain catheter into the left pleural space with aspiration of 650 cc of dark yellow pleural fluid. Samples were sent to the laboratory as requested by the ordering clinical team. Electronically Signed   By: Sandi Mariscal M.D.   On: 09/23/2019 17:09      ASSESSMENT/PLAN   Left lung mass with associated hilar lymphadenopathy   - possible infectious vs malignant in etiology    - agree with empiric tx for CAP   - s/p chest tube insertion - fluid cultures pending    - will need interval imaging /PET to re-evaluate    - will discuss and plan for bronchoscopic evaluation     -agree with workup including stre pneumoniae and legionella    - fungitell is in process    -procalcitonin is elevated at 4.92 -nasal MRSA in process   -patient is on vancomycin/zosyn empirically - patient febrile        Left lung empyema and Atelectasis  -will add recruitment maneuvers via [facility] q8h with albuterol  and mucomyst 70ml bid 20% to regimen  -s/p Chest tube with active yellow mucoid drainage.    Thank you for allowing me to participate in the care of this patient.    Patient/Family are satisfied with care plan and all questions have been answered.  This document was prepared using Dragon voice recognition software and may include unintentional dictation errors.     Ottie Glazier, M.D.  Division of [facility]

## 2019-09-24 NOTE — Progress Notes (Signed)
PROGRESS NOTE    Alan Wolf  XBM:[id number] DOB: [birth year] DOA: 09/19/2019 PCP: Marguerita Merles, MD   Brief Narrative:  HPI: Alan Wolf is a 69 y.o. male without significant past medical history, who presents with left flank pain.  Patient states that he has been having left flank pain for almost 1 month.  The pain is located in the left flank area, constant, sharp, moderate to severe, nonradiating.  It is aggravated by movement or deep breath.  Patient has mild cough and mild shortness of breath.  Occasionally he coughs up streaks of pink-colored mucus.  Denies fever or chills.He was initially seen and thought to have pneumonia on chest x-ray. He got 2 different antibiotics but is not any better.  Patient does not have nausea, vomiting, diarrhea, abdominal pain, symptoms of UTI or unilateral weakness.   Pt was seen in ED 09/12/19, and had CT-renal stone protocol which showed small LEFT pleural effusion and a small nonspecific lucent focus within the posterior aspect of the L4 vertebral body. Pt states that he has appointment with pulmonologist next Monday.  7/30: Patient seen and examined. Patient's daughter is at bedside. Long discussion regarding lung findings, preceding symptoms, ongoing work-up. Patient himself complains of left side and flank pain especially worse when he coughs.  7/31: Patient seen and examined.  Overnight patient became more dyspneic requiring 3 L supplemental oxygen.  Stat chest x-ray performed this morning showed significant interval increase in size of left pleural effusion.  Fluids discontinued.  Patient given Lasix 40 mg IV x1.  On my evaluation patient is breathing little better.  He is still dependent on 2 to 3 L nasal cannula.  Fever T-max 100.8 noted this morning.  Remains on broad-spectrum IV antibiotics.  All cultures no growth to date.  8/1: Patient seen and examined.  Continues to spike intermittent fevers.  White count decreasing.  Remains on 2 L  nasal cannula.  This morning is endorsing shortness of breath.  Chest x-ray demonstrates persistent large left pleural effusion with likely associated pneumonia and atelectasis.  Ultrasound-guided thoracentesis ordered.  Likely will not happen until tomorrow.  All cultures no growth to date.  8/2: Patient seen and examined.  Fevers noted overnight.  White blood cell count continues to decrease.  Remains on 3 L nasal cannula.  Shortness of breath persistent.  Unfortunately this morning the patient was sent for an ultrasound-guided thoracentesis.  Communicated with Dr. Shon Baton interventional radiology.  Thoracentesis could not be performed due to complex and likely loculated in nature of the pleural effusion.  Dr. Pascal Lux recommending repeat chest CT with contrast and CTS evaluation.  Communicated with Dr. Genevive Bi who will consult on patient.  8/3: Patient seen and examined.  Status post CT-guided chest tube placement.  Active drainage from chest tube noted.  Patient is walking around the room and in much better spirits.  He is feeling better.  Plan of care discussed with pulmonary consultant Dr. Lanney Gins.  Plan for intrapleural TPA with CT surgery today.   Assessment & Plan:   Principal Problem:   Mass of lower lobe of left lung Active Problems:   Leukocytosis   Left flank pain  Lung mass Possible community acquired pneumonia Sepsis secondary to above   CTA showed 5.7 cm left lower lobe mass with bronchial adenopathy.  CT also showed small left pleural effusion complicated by areas of loculation.  Etiology is not clear.  -Patient had some fevers over interval and elevated procalcitonin, raising the possibility  of atypical infection. Unable to exclude malignancy at this time.  8/3: Patient had CT-guided chest tube placed after failure of thoracentesis.  Chest tube is actively draining.  Patient symptomatically much improved.  Plan for intrapleural TPA. Plan: Continue broad-spectrum antibiotics,  vancomycin, Zosyn, azithromycin Follow blood urine and sputum cultures, no growth to date As needed bronchodilators MetaNeb every 8 hours with albuterol added Mucomyst twice daily Cough suppression, Tessalon Perles and promethazine/codeine Check strep urinary antigen, negative Check Legionella urinary antigen, negative Check Fungitell, pending -CT surgery for intrapleural TPA.  This will likely to be done daily for 3 days.  An attempt to avoid VATS.   Acute hypoxic respiratory failure Patient with large left pleural effusion.  Persistent despite treatment IV Lasix.  Thoracentesis unsuccessful due to loculated nature of effusion Image guided chest tube placed with good result no active drainage Patient weaned off supplemental oxygen as of this morning Plan: Intrapleural TPA per CT surgery Bronchodilator therapy per pulmonary recommendations Supplemental oxygen as needed  Leukocytosis:  WBC 11.7-->24-->26-->21-->16 -on antibiotics as above  Left flank pain:  Due to left lung mass -As needed Percocet and morphine for pain -Add scheduled Toradol -Add scheduled gabapentin -Add lidoderm patch   DVT prophylaxis: Lovenox Code Status: Full Family Communication: Daughter at bedside 8/3 Disposition Plan: Status is: Inpatient  Remains inpatient appropriate because:Inpatient level of care appropriate due to severity of illness   Dispo: The patient is from: Home              Anticipated d/c is to: Home              Anticipated d/c date is: 3 days              Patient currently is not medically stable to d/c.  Chest tube in place with active drainage.  Chest tube drainage appears more infectious in nature.  Still unable to entirely exclude malignancy.  Cytology on pleural fluid sent and pending.  CT surgery following for intrapleural TPA.  Disposition plan pending.  Consultants:   Oncology  Pulmonary  Procedures:   None  Antimicrobials:   Rocephin, discontinued  09/20/2019  Zosyn, started 09/20/2019  Azithromycin, started 09/19/2019  Vancomycin, started 09/20/2019   Subjective: Patient seen and examined.  Status post chest tube insertion.  Dyspnea improved.  Weaned off supplemental oxygen.  Cough and chest wall pain improved as well   objective: Vitals:   09/24/19 0145 09/24/19 0442 09/24/19 0745 09/24/19 0801  BP:  139/64  119/68  Pulse:  94  80  Resp:  18  18  Temp:  100 F (37.8 C)  98.1 F (36.7 C)  TempSrc:  Oral  Oral  SpO2: 94% 91% 93% 97%  Weight:      Height:        Intake/Output Summary (Last 24 hours) at 09/24/2019 1055 Last data filed at 09/24/2019 0925 Gross per 24 hour  Intake 712.41 ml  Output 200 ml  Net 512.41 ml   Filed Weights   09/19/19 0458 09/19/19 2152  Weight: 70.3 kg 68.6 kg    Examination:  General:  No acute distress HEENT: Normocephalic, atraumatic, wean off supplemental oxygen Neck, supple, trachea midline, no tenderness Heart: Regular rate and rhythm, S1/S2 normal, no murmurs Lungs:  Lung sounds improving on left.  Left-sided chest tube in place.  Active drainage.  No adventitious sounds  abdomen: Soft, nontender, nondistended, positive bowel sounds Extremities: Normal, atraumatic, no clubbing or cyanosis, normal muscle tone Skin: No  rashes or lesions, normal color Neurologic: Cranial nerves grossly intact, sensation intact, alert and oriented x3 Psychiatric: Normal affect      Data Reviewed: I have personally reviewed following labs and imaging studies  CBC: Recent Labs  Lab 09/19/19 0518 09/20/19 0452 09/21/19 0419 09/22/19 0336 09/23/19 0422  WBC 11.7* 24.2* 26.9* 21.8* 16.7*  NEUTROABS 8.6*  --  23.9* 19.2* 13.7*  HGB 14.5 14.1 12.9* 12.8* 12.7*  HCT 42.3 42.5 36.8* 38.0* 38.4*  MCV 89.8 90.2 88.2 89.8 90.4  PLT 332 292 238 248 621   Basic Metabolic Panel: Recent Labs  Lab 09/20/19 0452 09/21/19 0419 09/22/19 0336 09/23/19 0422 09/24/19 0415  NA 135 136 137 135 137    K 4.0 4.2 3.6 3.4* 3.5  CL 98 102 98 95* 97*  CO2 26 25 29 29 30   GLUCOSE 165* 243* 224* 258* 194*  BUN 19 18 19 22  28*  CREATININE 1.13 1.24 1.32* 1.49* 1.47*  CALCIUM 8.8* 8.3* 8.4* 8.4* 8.5*  MG  --  1.9  --   --   --    GFR: Estimated Creatinine Clearance: 47.3 mL/min (A) (by C-G formula based on SCr of 1.47 mg/dL (H)). Liver Function Tests: Recent Labs  Lab 09/19/19 0518  AST 22  ALT 28  ALKPHOS 110  BILITOT 1.0  PROT 8.2*  ALBUMIN 3.7   No results for input(s): LIPASE, AMYLASE in the last 168 hours. No results for input(s): AMMONIA in the last 168 hours. Coagulation Profile: Recent Labs  Lab 09/19/19 1224  INR 1.1   Cardiac Enzymes: No results for input(s): CKTOTAL, CKMB, CKMBINDEX, TROPONINI in the last 168 hours. BNP (last 3 results) No results for input(s): PROBNP in the last 8760 hours. HbA1C: No results for input(s): HGBA1C in the last 72 hours. CBG: Recent Labs  Lab 09/19/19 0927  GLUCAP 119*   Lipid Profile: No results for input(s): CHOL, HDL, LDLCALC, TRIG, CHOLHDL, LDLDIRECT in the last 72 hours. Thyroid Function Tests: No results for input(s): TSH, T4TOTAL, FREET4, T3FREE, THYROIDAB in the last 72 hours. Anemia Panel: No results for input(s): VITAMINB12, FOLATE, FERRITIN, TIBC, IRON, RETICCTPCT in the last 72 hours. Sepsis Labs: Recent Labs  Lab 09/20/19 0452 09/20/19 1455 09/20/19 1703  PROCALCITON 4.92  --   --   LATICACIDVEN  --  2.3* 1.8    Recent Results (from the past 240 hour(s))  SARS Coronavirus 2 by RT PCR (hospital order, performed in [facility] hospital lab) Nasopharyngeal Nasopharyngeal Swab     Status: None   Collection Time: 09/19/19  8:40 AM   Specimen: Nasopharyngeal Swab  Result Value [place] Status   SARS Coronavirus 2 NEGATIVE NEGATIVE Final    Comment: (NOTE) SARS-CoV-2 target nucleic acids are NOT DETECTED.  The SARS-CoV-2 RNA is generally detectable in upper and lower respiratory specimens during the acute  phase of infection. The lowest concentration of SARS-CoV-2 viral copies this assay can detect is 250 copies / mL. A negative result does not preclude SARS-CoV-2 infection and should not be used as the sole basis for treatment or other patient management decisions.  A negative result may occur with improper specimen collection / handling, submission of specimen other than nasopharyngeal swab, presence of viral mutation(s) within the areas targeted by this assay, and inadequate number of viral copies (<250 copies / mL). A negative result must be combined with clinical observations, patient history, and epidemiological information.  Fact Sheet for Patients:   StrictlyIdeas.no  Fact Sheet for Healthcare  Providers: BankingDealers.co.za  This test is not yet approved or  cleared by the [place] and has been authorized for detection and/or diagnosis of SARS-CoV-2 by FDA under an Emergency Use Authorization (EUA).  This EUA will remain in effect (meaning this test can be used) for the duration of the COVID-19 declaration under Section 564(b)(1) of the Act, 21 U.S.C. section 360bbb-3(b)(1), unless the authorization is terminated or revoked sooner.  Performed at [facility], [address] [place], [place] [address]   CULTURE, BLOOD (ROUTINE X 2) w Reflex to ID Panel     Status: None   Collection Time: 09/19/19  6:34 PM   Specimen: BLOOD  Result Value Ref [place] Status   Specimen Description BLOOD LEFT ANTECUBITAL  Final   Special Requests   Final    BOTTLES DRAWN AEROBIC AND ANAEROBIC Blood Culture adequate volume   Culture   Final    NO GROWTH 5 DAYS Performed at [facility], [address] [place], [place] [address]    Report Status 09/24/2019 FINAL  Final  CULTURE, BLOOD (ROUTINE X 2) w Reflex to ID Panel     Status: None   Collection Time: 09/19/19  6:34 PM   Specimen: BLOOD  Result Value Ref [place]  Status   Specimen Description BLOOD BLOOD LEFT HAND  Final   Special Requests   Final    BOTTLES DRAWN AEROBIC AND ANAEROBIC Blood Culture adequate volume   Culture   Final    NO GROWTH 5 DAYS Performed at [facility], [address] [place], [place] [address]    Report Status 09/24/2019 FINAL  Final  Expectorated sputum assessment w rflx to resp cult     Status: None   Collection Time: 09/20/19  5:23 AM   Specimen: Sputum  Result Value Ref [place] Status   Specimen Description SPUTUM  Final   Special Requests NONE  Final   Sputum evaluation   Final    THIS SPECIMEN IS ACCEPTABLE FOR SPUTUM CULTURE Performed at [facility], [address] [place], [place] [address]    Report Status 09/20/2019 FINAL  Final  Culture, respiratory     Status: None   Collection Time: 09/20/19  5:23 AM   Specimen: SPU  Result Value Ref [place] Status   Specimen Description   Final    SPUTUM Performed at [facility], [address], [place], [place] [address]    Special Requests   Final    NONE Reflexed from [phone] Performed at [facility], [place], [place] [address]    Gram Stain   Final    MODERATE WBC PRESENT,BOTH PMN AND MONONUCLEAR RARE SQUAMOUS EPITHELIAL CELLS PRESENT RARE GRAM POSITIVE COCCI IN PAIRS RARE GRAM POSITIVE RODS    Culture   Final    Consistent with normal respiratory flora. Performed at [facility], [place] [address], [place], [place] [address]    Report Status 09/22/2019 FINAL  Final  MRSA PCR Screening     Status: None   Collection Time: 09/20/19  1:34 PM   Specimen: Nasopharyngeal  Result Value Ref [place] Status   MRSA by PCR NEGATIVE NEGATIVE Final    Comment:        The GeneXpert MRSA Assay (FDA approved for NASAL specimens only), is one component of a comprehensive MRSA colonization surveillance program. It is not intended to diagnose MRSA infection nor to guide or monitor treatment for MRSA  infections. Performed  at [facility], [address] [place], [place] [address]   Aerobic/Anaerobic Culture (surgical/deep wound)     Status: None (Preliminary result)   Collection Time: 09/23/19  4:59 PM   Specimen: Peritoneal Washings; Abscess  Result Value Ref [place] Status   Specimen Description   Final    PERITONEAL LEFT FLUID Performed at [facility], [address] [place], [place] [address]    Special Requests   Final    NONE Performed at [facility], [address] [place],  [address]    Gram Stain   Final    MODERATE WBC PRESENT,BOTH PMN AND MONONUCLEAR NO ORGANISMS SEEN Performed at [facility], [place] [address], [place],  [address]    Culture PENDING  Incomplete   Report Status PENDING  Incomplete         Radiology Studies: CT CHEST W CONTRAST  Result Date: 09/23/2019 CLINICAL DATA:  69 year old male with abnormal x-ray. Pleural effusion. EXAM: CT CHEST WITH CONTRAST TECHNIQUE: Multidetector CT imaging of the chest was performed during intravenous contrast administration. CONTRAST:  42mL OMNIPAQUE IOHEXOL 300 MG/ML  SOLN COMPARISON:  Chest CT dated 09/19/2019 and radiograph dated 09/22/2019. FINDINGS: Cardiovascular: There is no cardiomegaly or pericardial effusion. Coronary vascular calcification primarily involving the LAD. The thoracic aorta is unremarkable. The origins of the great vessels of the aortic arch are patent as visualized. Evaluation of the pulmonary arteries is limited due to suboptimal visualization of the distal branches. No large or central pulmonary artery embolus identified. Mediastinum/Nodes: Mildly enlarged right hilar lymph node measures 12 mm in short axis. Subcarinal lymph node measures 11 mm. The esophagus is grossly unremarkable. No mediastinal fluid collection. Lungs/Pleura: There is a large left pleural effusion with probable areas of loculation. There is compressive atelectasis of the majority  of the left lung with a small aerated portion of the left upper lobe. Small pockets of air within the effusion inferiorly may be related to recent procedure or represent an infectious process. A bronchopleural fistula is not excluded. There is a small right pleural effusion. Partial consolidative changes involving the right lung base and right middle lobe may represent atelectasis or infiltrate. Smaller ground-glass clusters in the right upper lobe may represent pneumonia. There is no pneumothorax. The central airways are patent. Upper Abdomen: No acute abnormality. Musculoskeletal: No chest wall abnormality. No acute or significant osseous findings. IMPRESSION: 1. No central pulmonary artery embolus identified. 2. Large left pleural effusion with consolidation of the majority of the left lung. Small pockets of air within the effusion inferiorly may be related to recent procedure or represent an infectious process. A bronchopleural fistula is not excluded. 3. Small right pleural effusion. 4. Partial consolidative changes involving the right lung base and right middle lobe may represent atelectasis or infiltrate. Smaller ground-glass clusters in the right upper lobe may represent pneumonia. Clinical correlation and follow-up recommended. 5. Mildly enlarged right hilar and subcarinal lymph nodes, likely reactive. 6. Aortic Atherosclerosis (ICD10-I70.0). Electronically Signed   By: Anner Crete M.D.   On: 09/23/2019 15:46   [place] CHEST (PLEURAL EFFUSION)  Result Date: 09/23/2019 CLINICAL DATA:  Concern for worsening left-sided pleural effusion. EXAM: CHEST ULTRASOUND COMPARISON:  Chest CT-09/19/2019; chest radiograph-09/22/2019 FINDINGS: Sonographic evaluation of the left hemithorax demonstrates apparent dense consolidation of the left lung (favored) versus densely loculated pleural fluid, not amenable to ultrasound-guided thoracentesis. No thoracentesis attempted. IMPRESSION: Dense consolidation of the left lung  (favored) versus densely loculated pleural fluid, not  amenable to ultrasound-guided thoracentesis. No thoracentesis attempted. Recommend proceeding with repeat contrast-enhanced chest CT for further evaluation. Critical Value/emergent results were relayed to referring physician via epic messenger at the time of interpretation on 09/23/2019 at 10:37 am to provider [facility] , who acknowledged these results. Electronically Signed   By: Sandi Mariscal M.D.   On: 09/23/2019 10:38   DG Chest Port 1 View  Result Date: 09/24/2019 CLINICAL DATA:  Chest tube placement EXAM: PORTABLE CHEST 1 VIEW COMPARISON:  09/22/2019 FINDINGS: A left chest tube has been place with evacuation of much of the left pleural effusion. A moderate-sized effusion remains. Residual consolidation or atelectasis in the left lung. Linear atelectasis in the right lung base. Heart size is partly obscured but appears normal. IMPRESSION: Interval placement of left chest tube with evacuation of much of the left pleural effusion. Residual consolidation or atelectasis in the left lung. Electronically Signed   By: Lucienne Capers M.D.   On: 09/24/2019 03:24   ECHOCARDIOGRAM COMPLETE  Result Date: 09/22/2019    ECHOCARDIOGRAM REPORT   Patient Name:   SHAFIQ LARCH Date of Exam: 09/22/2019 Medical Rec #:  [id number]        Height:       71.0 in Accession #:    [id number]       Weight:       151.2 lb Date of Birth:  07-27-1950        BSA:          1.872 m Patient Age:    32 years         BP:           120/69 mmHg Patient Gender: M                HR:           94 bpm. Exam Location:  [facility] Procedure: 2D Echo Indications:     CHF-Acute Diastolic 875.64/P32.95  History:         Patient has no prior history of Echocardiogram examinations.  Sonographer:     Avanell Shackleton Referring Phys:  [id number] Sidney Ace Diagnosing Phys: Serafina Royals MD IMPRESSIONS  1. Left ventricular ejection fraction, by estimation, is 65 to 70%. The left ventricle has normal  function. The left ventricle has no regional wall motion abnormalities. Left ventricular diastolic parameters were normal.  2. Right ventricular systolic function is normal. The right ventricular size is normal. There is moderately elevated pulmonary artery systolic pressure.  3. The mitral valve is normal in structure. Trivial mitral valve regurgitation.  4. The aortic valve is normal in structure. Aortic valve regurgitation is trivial. FINDINGS  Left Ventricle: Left ventricular ejection fraction, by estimation, is 65 to 70%. The left ventricle has normal function. The left ventricle has no regional wall motion abnormalities. The left ventricular internal cavity size was small. There is no left ventricular hypertrophy. Left ventricular diastolic parameters were normal. Right Ventricle: The right ventricular size is normal. No increase in right ventricular wall thickness. Right ventricular systolic function is normal. There is moderately elevated pulmonary artery systolic pressure. The tricuspid regurgitant velocity is 3.16 m/s, and with an assumed right atrial pressure of 10 mmHg, the estimated right ventricular systolic pressure is 06.3 mmHg. Left Atrium: Left atrial size was normal in size. Right Atrium: Right atrial size was normal in size. Pericardium: There is no evidence of pericardial effusion. Mitral Valve: The mitral valve is normal in structure. Trivial mitral valve regurgitation. Tricuspid Valve:  The tricuspid valve is normal in structure. Tricuspid valve regurgitation is not demonstrated. Aortic Valve: The aortic valve is normal in structure. Aortic valve regurgitation is trivial. Pulmonic Valve: The pulmonic valve was normal in structure. Pulmonic valve regurgitation is not visualized. Aorta: The aortic root and ascending aorta are structurally normal, with no evidence of dilitation. IAS/Shunts: No atrial level shunt detected by color flow Doppler.  LEFT VENTRICLE PLAX 2D LVIDd:         4.26 cm   Diastology LVIDs:         2.64 cm  LV e' lateral:   8.05 cm/s LV PW:         0.68 cm  LV E/e' lateral: 8.7 LV IVS:        0.71 cm  LV e' medial:    8.05 cm/s LVOT diam:     2.00 cm  LV E/e' medial:  8.7 LVOT Area:     3.14 cm  RIGHT VENTRICLE             IVC RV S prime:     19.70 cm/s  IVC diam: 2.32 cm TAPSE (M-mode): 2.8 cm LEFT ATRIUM             Index       RIGHT ATRIUM           Index LA diam:        2.80 cm 1.50 cm/m  RA Area:     16.00 cm LA Vol (A2C):   35.9 ml 19.17 ml/m RA Volume:   41.20 ml  22.00 ml/m LA Vol (A4C):   35.0 ml 18.69 ml/m LA Biplane Vol: 35.1 ml 18.75 ml/m   AORTA Ao Root diam: 3.00 cm MITRAL VALVE               TRICUSPID VALVE MV Area (PHT): 2.39 cm    TR Peak grad:   39.9 mmHg MV Decel Time: 318 msec    TR Vmax:        316.00 cm/s MV E velocity: 70.00 cm/s MV A velocity: 93.00 cm/s  SHUNTS MV E/A ratio:  0.75        Systemic Diam: 2.00 cm Serafina Royals MD Electronically signed by Serafina Royals MD Signature Date/Time: 09/22/2019/7:30:23 PM    Final    CT IMAGE GUIDED FLUID DRAIN BY CATHETER  Result Date: 09/23/2019 INDICATION: Complex left-sided pleural effusion. Request made for image guided placement of left-sided chest tube prior to potential definitive VATS. EXAM: CT IMAGE GUIDED FLUID DRAIN BY CATHETER COMPARISON:  Chest CT-earlier same day; 09/19/2019: Chest ultrasound-09/23/2019 MEDICATIONS: The patient is currently admitted to the hospital and receiving intravenous antibiotics. The antibiotics were administered within an appropriate time frame prior to the initiation of the procedure. ANESTHESIA/SEDATION: Moderate (conscious) sedation was employed during this procedure. A total of Versed 1 mg and Fentanyl 50 mcg was administered intravenously. Moderate Sedation Time: 25 minutes. The patient's level of consciousness and vital signs were monitored continuously by radiology nursing throughout the procedure under my direct supervision. CONTRAST:  None COMPLICATIONS: None  immediate. PROCEDURE: Informed written consent was obtained from the patient after a discussion of the risks, benefits and alternatives to treatment. The patient was placed supine on the CT gantry and a pre procedural CT was performed re-demonstrating the known large partially loculated left-sided pleural effusion. The procedure was planned. A timeout was performed prior to the initiation of the procedure. The skin overlying the inferolateral aspect the left lower chest was prepped  and draped in the usual sterile fashion. The overlying soft tissues were anesthetized with 1% lidocaine with epinephrine. Appropriate trajectory was planned with the use of a 22 gauge spinal needle. An 18 gauge trocar needle was advanced into the abscess/fluid collection and a short Amplatz super stiff wire was coiled within the collection. Appropriate positioning was confirmed with a limited CT scan. The tract was serially dilated allowing placement of a 16 French all-purpose drainage catheter. Appropriate positioning was confirmed with a limited postprocedural CT scan. Approximately 650 cc ml of dark yellow fluid was aspirated. The tube was connected to a pleura vac device and sutured in place. A dressing was placed. The patient tolerated the procedure well without immediate post procedural complication. IMPRESSION: Successful CT guided placement of a 28 French all purpose drain catheter into the left pleural space with aspiration of 650 cc of dark yellow pleural fluid. Samples were sent to the laboratory as requested by the ordering clinical team. Electronically Signed   By: Sandi Mariscal M.D.   On: 09/23/2019 17:09        Scheduled Meds: . acetylcysteine  4 mL Nebulization BID  . benzonatate  200 mg Oral TID  . gabapentin  300 mg Oral TID  . ketorolac  15 mg Intravenous Q6H  . lidocaine  1 patch Transdermal Q24H  . mouth rinse  15 mL Mouth Rinse BID   Continuous Infusions: . sodium chloride Stopped (09/24/19 0145)  .  azithromycin Stopped (09/23/19 1814)  . piperacillin-tazobactam (ZOSYN)  IV 3.375 g (09/24/19 0547)  . vancomycin 1,000 mg (09/24/19 0830)     LOS: 4 days    Time spent: 25 minutes    Sidney Ace, MD [facility] Pager [phone]  If 7PM-7AM, please contact night-coverage 09/24/2019, 10:55 AM

## 2019-09-25 ENCOUNTER — Inpatient Hospital Stay: Payer: Medicare HMO

## 2019-09-25 DIAGNOSIS — R109 Unspecified abdominal pain: Secondary | ICD-10-CM

## 2019-09-25 LAB — BASIC METABOLIC PANEL
Anion gap: 9 (ref 5–15)
BUN: 28 mg/dL — ABNORMAL HIGH (ref 8–23)
CO2: 27 mmol/L (ref 22–32)
Calcium: 8.3 mg/dL — ABNORMAL LOW (ref 8.9–10.3)
Chloride: 104 mmol/L (ref 98–111)
Creatinine, Ser: 1.14 mg/dL (ref 0.61–1.24)
GFR calc Af Amer: >60 mL/min (ref >60)
GFR calc non Af Amer: >60 mL/min (ref >60)
Glucose, Bld: 165 mg/dL — ABNORMAL HIGH (ref 70–99)
Potassium: 3.4 mmol/L — ABNORMAL LOW (ref 3.5–5.1)
Sodium: 140 mmol/L (ref 135–145)

## 2019-09-25 LAB — CBC
HCT: 32.7 % — ABNORMAL LOW (ref 39.0–52.0)
Hemoglobin: 10.9 g/dL — ABNORMAL LOW (ref 13.0–17.0)
MCH: 29.9 pg (ref 26.0–34.0)
MCHC: 33.3 g/dL (ref 30.0–36.0)
MCV: 89.6 fL (ref 80.0–100.0)
Platelets: 290 10*3/uL (ref 150–400)
RBC: 3.65 MIL/uL — ABNORMAL LOW (ref 4.22–5.81)
RDW: 12.8 % (ref 11.5–15.5)
WBC: 11.1 10*3/uL — ABNORMAL HIGH (ref 4.0–10.5)
nRBC: 0 % (ref 0.0–0.2)

## 2019-09-25 MED ORDER — POTASSIUM CHLORIDE CRYS ER 20 MEQ PO TBCR
40.0000 meq | EXTENDED_RELEASE_TABLET | Freq: Once | ORAL | Status: AC
  Administered 2019-09-25: 40 meq via ORAL
  Filled 2019-09-25: qty 2

## 2019-09-25 MED ORDER — VANCOMYCIN HCL 1250 MG/250ML IV SOLN
1250.0000 mg | INTRAVENOUS | Status: DC
  Filled 2019-09-25: qty 250

## 2019-09-25 NOTE — Progress Notes (Signed)
Pulmonary Medicine          Date: 09/25/2019,   MRN# [id number] Alan Wolf 08-08-1950     AdmissionWeight: 70.3 kg                 CurrentWeight: 68.6 kg  Referring physician: Dr Blaine Hamper    CHIEF COMPLAINT:   Lung Mass of left lowe lobe with hilar lymphadenopathy.    SUBJECTIVE   Patient is resting in bed. Daughter at bedside during my evaluation. He reports feeling well, asking about going home. Reviewed care plan including repeat CXR and CT with potential removal of chest tube after that.   He asked regarding any chance of cancer or tumor hiding under infiltrate, I have discussed that post recovery we can re-image after several weeks and can do PET scan to determine need for bronchoscopy and biopsy. Patient and daughter are very pleased with care and appreciative of everyone involved.   PAST MEDICAL HISTORY   History reviewed. No pertinent past medical history.   SURGICAL HISTORY   History reviewed. No pertinent surgical history.   FAMILY HISTORY   Family History  Problem Relation Age of Onset  . Diabetes Mellitus II Mother   . Lung cancer Brother      SOCIAL HISTORY   Social History   Tobacco Use  . Smoking status: Never Smoker  . Smokeless tobacco: Never Used  Substance Use Topics  . Alcohol use: Never  . Drug use: Never     MEDICATIONS    Home Medication:    Current Medication:  Current Facility-Administered Medications:  .  0.9 %  sodium chloride infusion, , Intravenous, PRN, Sidney Ace, MD, Paused at 09/24/19 0145 .  acetaminophen (TYLENOL) tablet 650 mg, 650 mg, Oral, Q6H PRN, [name] B, MD, 650 mg at 09/24/19 1743 .  acetylcysteine (MUCOMYST) 20 % nebulizer / oral solution 4 mL, 4 mL, Nebulization, BID, Lanney Gins, Norberto Wishon, MD, 4 mL at 09/25/19 0950 .  albuterol (PROVENTIL) (2.5 MG/3ML) 0.083% nebulizer solution 2.5 mg, 2.5 mg, Nebulization, Q4H PRN, [name], MD, 2.5 mg at 09/25/19 0950 .  alteplase (tPA)  10mg  in NS 46mL for Dr.Oaks (intrapleural administration/[facility]), , Intrapleural, Once, Tylene Fantasia, PA-C .  benzonatate (TESSALON) capsule 200 mg, 200 mg, Oral, TID, [name], Sudheer B, MD, 200 mg at 09/25/19 1527 .  diphenhydrAMINE (BENADRYL) capsule 25 mg, 25 mg, Oral, Q6H PRN, Sreenath, Sudheer B, MD .  gabapentin (NEURONTIN) tablet 300 mg, 300 mg, Oral, TID, Sreenath, Sudheer B, MD, 300 mg at 09/25/19 1527 .  lidocaine (LIDODERM) 5 % 1 patch, 1 patch, Transdermal, Q24H, Sreenath, Sudheer B, MD, 1 patch at 09/25/19 1526 .  MEDLINE mouth rinse, 15 mL, Mouth Rinse, BID, Sreenath, Sudheer B, MD, 15 mL at 09/25/19 0914 .  morphine 2 MG/ML injection 2 mg, 2 mg, Intravenous, Q3H PRN, [name], Sudheer B, MD, 2 mg at 09/23/19 1052 .  ondansetron (ZOFRAN) injection 4 mg, 4 mg, Intravenous, Q8H PRN, [name], MD .  oxyCODONE-acetaminophen (PERCOCET/ROXICET) 5-325 MG per tablet 1 tablet, 1 tablet, Oral, Q4H PRN, [name], MD, 1 tablet at 09/25/19 0915 .  piperacillin-tazobactam (ZOSYN) IVPB 3.375 g, 3.375 g, Intravenous, Q8H, Shanlever, Pierce Crane, RPH, Last Rate: 12.5 mL/hr at 09/25/19 1525, 3.375 g at 09/25/19 1525 .  promethazine-codeine (PHENERGAN with CODEINE) 6.25-10 MG/5ML syrup 5 mL, 5 mL, Oral, Q6H PRN, [name], Sudheer B, MD .  Derrill Memo ON 09/26/2019] vancomycin (VANCOREADY) IVPB 1250 mg/250 mL, 1,250 mg, Intravenous,  Q24H, Dallie Piles, RPH    ALLERGIES   Penicillins     REVIEW OF SYSTEMS    Review of Systems:  Gen:  Denies  fever, sweats, chills weigh loss  HEENT: Denies blurred vision, double vision, ear pain, eye pain, hearing loss, nose bleeds, sore throat Cardiac:  No dizziness, chest pain or heaviness, chest tightness,edema Resp:   Denies cough or sputum porduction, shortness of breath,wheezing, hemoptysis,  Gi: Denies swallowing difficulty, stomach pain, nausea or vomiting, diarrhea, constipation, bowel incontinence Gu:  Denies bladder incontinence, burning  urine Ext:   Denies Joint pain, stiffness or swelling Skin: Denies  skin rash, easy bruising or bleeding or hives Endoc:  Denies polyuria, polydipsia , polyphagia or weight change Psych:   Denies depression, insomnia or hallucinations   Other:  All other systems negative   VS: BP 133/76   Pulse 87   Temp 98.5 F (36.9 C) (Oral)   Resp 16   Ht 5\' 11"  (1.803 m)   Wt 68.6 kg   SpO2 94%   BMI 21.09 kg/m      PHYSICAL EXAM    GENERAL:NAD, no fevers, chills, no weakness no fatigue HEAD: Normocephalic, atraumatic.  EYES: Pupils equal, round, reactive to light. Extraocular muscles intact. No scleral icterus.  MOUTH: Moist mucosal membrane. Dentition intact. No abscess noted.  EAR, NOSE, THROAT: Clear without exudates. No external lesions.  NECK: Supple. No thyromegaly. No nodules. No JVD.  PULMONARY: clear to auscultation with minimal rhonchi at left lung CARDIOVASCULAR: S1 and S2. Regular rate and rhythm. No murmurs, rubs, or gallops. No edema. Pedal pulses 2+ bilaterally.  GASTROINTESTINAL: Soft, nontender, nondistended. No masses. Positive bowel sounds. No hepatosplenomegaly.  MUSCULOSKELETAL: No swelling, clubbing, or edema. Range of motion full in all extremities.  NEUROLOGIC: Cranial nerves II through XII are intact. No gross focal neurological deficits. Sensation intact. Reflexes intact.  SKIN: No ulceration, lesions, rashes, or cyanosis. Skin warm and dry. Turgor intact.  PSYCHIATRIC: Mood, affect within normal limits. The patient is awake, alert and oriented x 3. Insight, judgment intact.       IMAGING    DG Chest 1 View  Result Date: 09/21/2019 CLINICAL DATA:  69 year old with progressive shortness of breath with exertion over night and acute respiratory failure. Fever and cough. EXAM: Portable CHEST 1 VIEW COMPARISON:  CTA chest 09/19/2019. FINDINGS: Since the CT 2 days ago, marked increase in size of the now very large LEFT pleural effusion. Worsening dense  consolidation in the LEFT LOWER LOBE and lingula. New small RIGHT pleural effusion and associated mild passive atelectasis in the RIGHT LOWER LOBE. RIGHT lung remains clear otherwise. Heart moderately enlarged. Pulmonary vascularity normal. IMPRESSION: 1. Marked increase in size of the now large LEFT pleural effusion since the CT 2 days ago. 2. Worsening dense atelectasis and/or pneumonia in the LEFT LOWER LOBE and lingula. 3. New small RIGHT pleural effusion and associated mild passive atelectasis in the RIGHT LOWER LOBE. Electronically Signed   By: Evangeline Dakin M.D.   On: 09/21/2019 12:49   DG Chest 2 View  Result Date: 09/25/2019 CLINICAL DATA:  Chest pain around chest tube site EXAM: CHEST - 2 VIEW COMPARISON:  09/24/2019 FINDINGS: Pigtail LEFT thoracostomy tube again identified. Normal heart size, mediastinal contours, and pulmonary vascularity. Atherosclerotic calcification aorta. Bibasilar small pleural effusions and atelectasis slightly greater on RIGHT, with significantly decreased LEFT pleural effusion since previous exam. Decreased infiltrate LEFT mid lung. No pneumothorax or acute osseous findings. IMPRESSION: Decreased LEFT pleural  effusion and basilar atelectasis since prior study. Small residual bibasilar effusions and atelectasis. Improved LEFT midlung infiltrate. Electronically Signed   By: Lavonia Dana M.D.   On: 09/25/2019 08:24   CT CHEST W CONTRAST  Result Date: 09/23/2019 CLINICAL DATA:  69 year old male with abnormal x-ray. Pleural effusion. EXAM: CT CHEST WITH CONTRAST TECHNIQUE: Multidetector CT imaging of the chest was performed during intravenous contrast administration. CONTRAST:  58mL OMNIPAQUE IOHEXOL 300 MG/ML  SOLN COMPARISON:  Chest CT dated 09/19/2019 and radiograph dated 09/22/2019. FINDINGS: Cardiovascular: There is no cardiomegaly or pericardial effusion. Coronary vascular calcification primarily involving the LAD. The thoracic aorta is unremarkable. The origins of the  great vessels of the aortic arch are patent as visualized. Evaluation of the pulmonary arteries is limited due to suboptimal visualization of the distal branches. No large or central pulmonary artery embolus identified. Mediastinum/Nodes: Mildly enlarged right hilar lymph node measures 12 mm in short axis. Subcarinal lymph node measures 11 mm. The esophagus is grossly unremarkable. No mediastinal fluid collection. Lungs/Pleura: There is a large left pleural effusion with probable areas of loculation. There is compressive atelectasis of the majority of the left lung with a small aerated portion of the left upper lobe. Small pockets of air within the effusion inferiorly may be related to recent procedure or represent an infectious process. A bronchopleural fistula is not excluded. There is a small right pleural effusion. Partial consolidative changes involving the right lung base and right middle lobe may represent atelectasis or infiltrate. Smaller ground-glass clusters in the right upper lobe may represent pneumonia. There is no pneumothorax. The central airways are patent. Upper Abdomen: No acute abnormality. Musculoskeletal: No chest wall abnormality. No acute or significant osseous findings. IMPRESSION: 1. No central pulmonary artery embolus identified. 2. Large left pleural effusion with consolidation of the majority of the left lung. Small pockets of air within the effusion inferiorly may be related to recent procedure or represent an infectious process. A bronchopleural fistula is not excluded. 3. Small right pleural effusion. 4. Partial consolidative changes involving the right lung base and right middle lobe may represent atelectasis or infiltrate. Smaller ground-glass clusters in the right upper lobe may represent pneumonia. Clinical correlation and follow-up recommended. 5. Mildly enlarged right hilar and subcarinal lymph nodes, likely reactive. 6. Aortic Atherosclerosis (ICD10-I70.0). Electronically  Signed   By: Anner Crete M.D.   On: 09/23/2019 15:46   CT Angio Chest PE W and/or Wo Contrast  Result Date: 09/19/2019 CLINICAL DATA:  Chest pain with pleurisy or effusion suspected EXAM: CT ANGIOGRAPHY CHEST WITH CONTRAST TECHNIQUE: Multidetector CT imaging of the chest was performed using the standard protocol during bolus administration of intravenous contrast. Multiplanar CT image reconstructions and MIPs were obtained to evaluate the vascular anatomy. CONTRAST:  25mL OMNIPAQUE IOHEXOL 350 MG/ML SOLN COMPARISON:  Abdominal CT 09/12/2019 FINDINGS: Cardiovascular: Normal heart size. No pericardial effusion. Atheromatous calcification along the aorta and left main coronary. Mediastinum/Nodes: Mildly enlarged bronchial lymph nodes the left lower lobe, 11 mm in diameter. Lungs/Pleura: Ovoid area of decreased enhancement and vessel attenuation measuring up to 5.7 cm on axial slices. There is adjacent enhancing compressed appearing lung. Small left pleural effusion with some scalloping. Upper Abdomen: Negative Musculoskeletal: Negative for bone erosion. No evidence of osseous metastatic disease. Review of the MIP images confirms the above findings. IMPRESSION: 1. Concern for left lower lobe mass with bronchial adenopathy. Recommend referral to multi disciplinary thoracic oncology. Findings are not definitive for malignancy and could be related to round  atelectasis or atypical pneumonia, consider antibiotic therapy in the meantime. 2. Small left pleural effusion complicated by areas of loculation. 3. Negative for pulmonary embolism. Electronically Signed   By: Monte Fantasia M.D.   On: 09/19/2019 06:44   [place] CHEST (PLEURAL EFFUSION)  Result Date: 09/23/2019 CLINICAL DATA:  Concern for worsening left-sided pleural effusion. EXAM: CHEST ULTRASOUND COMPARISON:  Chest CT-09/19/2019; chest radiograph-09/22/2019 FINDINGS: Sonographic evaluation of the left hemithorax demonstrates apparent dense consolidation of  the left lung (favored) versus densely loculated pleural fluid, not amenable to ultrasound-guided thoracentesis. No thoracentesis attempted. IMPRESSION: Dense consolidation of the left lung (favored) versus densely loculated pleural fluid, not amenable to ultrasound-guided thoracentesis. No thoracentesis attempted. Recommend proceeding with repeat contrast-enhanced chest CT for further evaluation. Critical Value/emergent results were relayed to referring physician via epic messenger at the time of interpretation on 09/23/2019 at 10:37 am to provider [facility] , who acknowledged these results. Electronically Signed   By: Sandi Mariscal M.D.   On: 09/23/2019 10:38   US Venous Img Lower Bilateral (DVT)  Result Date: 09/19/2019 CLINICAL DATA:  Elevated D-dimer.  Evaluate for DVT. EXAM: BILATERAL LOWER EXTREMITY VENOUS DOPPLER ULTRASOUND TECHNIQUE: Gray-scale sonography with graded compression, as well as color Doppler and duplex ultrasound were performed to evaluate the lower extremity deep venous systems from the level of the common femoral vein and including the common femoral, femoral, profunda femoral, popliteal and calf veins including the posterior tibial, peroneal and gastrocnemius veins when visible. The superficial great saphenous vein was also interrogated. Spectral Doppler was utilized to evaluate flow at rest and with distal augmentation maneuvers in the common femoral, femoral and popliteal veins. COMPARISON:  None. FINDINGS: RIGHT LOWER EXTREMITY Common Femoral Vein: No evidence of thrombus. Normal compressibility, respiratory phasicity and response to augmentation. Saphenofemoral Junction: No evidence of thrombus. Normal compressibility and flow on color Doppler imaging. Profunda Femoral Vein: No evidence of thrombus. Normal compressibility and flow on color Doppler imaging. Femoral Vein: No evidence of thrombus. Normal compressibility, respiratory phasicity and response to augmentation. Popliteal  Vein: No evidence of thrombus. Normal compressibility, respiratory phasicity and response to augmentation. Calf Veins: No evidence of thrombus. Normal compressibility and flow on color Doppler imaging. Superficial Great Saphenous Vein: No evidence of thrombus. Normal compressibility. Venous Reflux:  None. Other Findings:  None. LEFT LOWER EXTREMITY Common Femoral Vein: No evidence of thrombus. Normal compressibility, respiratory phasicity and response to augmentation. Saphenofemoral Junction: No evidence of thrombus. Normal compressibility and flow on color Doppler imaging. Profunda Femoral Vein: No evidence of thrombus. Normal compressibility and flow on color Doppler imaging. Femoral Vein: No evidence of thrombus. Normal compressibility, respiratory phasicity and response to augmentation. Popliteal Vein: No evidence of thrombus. Normal compressibility, respiratory phasicity and response to augmentation. Calf Veins: No evidence of thrombus. Normal compressibility and flow on color Doppler imaging. Superficial Great Saphenous Vein: No evidence of thrombus. Normal compressibility. Venous Reflux:  None. Other Findings:  None. IMPRESSION: No evidence of DVT within either lower extremity. Electronically Signed   By: Sandi Mariscal M.D.   On: 09/19/2019 12:29   DG Chest Port 1 View  Result Date: 09/24/2019 CLINICAL DATA:  Chest tube placement EXAM: PORTABLE CHEST 1 VIEW COMPARISON:  09/22/2019 FINDINGS: A left chest tube has been place with evacuation of much of the left pleural effusion. A moderate-sized effusion remains. Residual consolidation or atelectasis in the left lung. Linear atelectasis in the right lung base. Heart size is partly obscured but appears normal. IMPRESSION: Interval placement of left  chest tube with evacuation of much of the left pleural effusion. Residual consolidation or atelectasis in the left lung. Electronically Signed   By: Lucienne Capers M.D.   On: 09/24/2019 03:24   DG Chest Port 1  View  Result Date: 09/22/2019 CLINICAL DATA:  Pleural effusion. EXAM: PORTABLE CHEST 1 VIEW COMPARISON:  Chest x-ray dated 09/21/2019. FINDINGS: Near complete opacification of the LEFT hemithorax, stable to slightly increased compared to yesterday's chest x-ray. Probable small RIGHT pleural effusion and/or RIGHT basilar atelectasis. RIGHT lung remains otherwise clear. No pneumothorax is seen. Cardiomediastinal size and position is grossly stable. IMPRESSION: 1. Near complete opacification of the LEFT hemithorax, stable to slightly increased compared to yesterday's chest x-ray, significantly increased compared to the chest CT angiogram of 09/19/2019. This is presumably a combination of large pleural effusion and LEFT lower lung pneumonia versus atelectasis. 2. Probable small pleural effusion and/or atelectasis at the RIGHT lung base. Electronically Signed   By: Franki Cabot M.D.   On: 09/22/2019 12:09   ECHOCARDIOGRAM COMPLETE  Result Date: 09/22/2019    ECHOCARDIOGRAM REPORT   Patient Name:   MOHAMADOU MACIVER Date of Exam: 09/22/2019 Medical Rec #:  [id number]        Height:       71.0 in Accession #:    [id number]       Weight:       151.2 lb Date of Birth:  13-Oct-1950        BSA:          1.872 m Patient Age:    69 years         BP:           120/69 mmHg Patient Gender: M                HR:           94 bpm. Exam Location:  [facility] Procedure: 2D Echo Indications:     CHF-Acute Diastolic 474.25/Z56.38  History:         Patient has no prior history of Echocardiogram examinations.  Sonographer:     Avanell Shackleton Referring Phys:  [id number] Sidney Ace Diagnosing Phys: Serafina Royals MD IMPRESSIONS  1. Left ventricular ejection fraction, by estimation, is 65 to 70%. The left ventricle has normal function. The left ventricle has no regional wall motion abnormalities. Left ventricular diastolic parameters were normal.  2. Right ventricular systolic function is normal. The right ventricular size is normal. There is  moderately elevated pulmonary artery systolic pressure.  3. The mitral valve is normal in structure. Trivial mitral valve regurgitation.  4. The aortic valve is normal in structure. Aortic valve regurgitation is trivial. FINDINGS  Left Ventricle: Left ventricular ejection fraction, by estimation, is 65 to 70%. The left ventricle has normal function. The left ventricle has no regional wall motion abnormalities. The left ventricular internal cavity size was small. There is no left ventricular hypertrophy. Left ventricular diastolic parameters were normal. Right Ventricle: The right ventricular size is normal. No increase in right ventricular wall thickness. Right ventricular systolic function is normal. There is moderately elevated pulmonary artery systolic pressure. The tricuspid regurgitant velocity is 3.16 m/s, and with an assumed right atrial pressure of 10 mmHg, the estimated right ventricular systolic pressure is 95.1 mmHg. Left Atrium: Left atrial size was normal in size. Right Atrium: Right atrial size was normal in size. Pericardium: There is no evidence of pericardial effusion. Mitral Valve: The mitral valve is normal in structure. Trivial  mitral valve regurgitation. Tricuspid Valve: The tricuspid valve is normal in structure. Tricuspid valve regurgitation is not demonstrated. Aortic Valve: The aortic valve is normal in structure. Aortic valve regurgitation is trivial. Pulmonic Valve: The pulmonic valve was normal in structure. Pulmonic valve regurgitation is not visualized. Aorta: The aortic root and ascending aorta are structurally normal, with no evidence of dilitation. IAS/Shunts: No atrial level shunt detected by color flow Doppler.  LEFT VENTRICLE PLAX 2D LVIDd:         4.26 cm  Diastology LVIDs:         2.64 cm  LV e' lateral:   8.05 cm/s LV PW:         0.68 cm  LV E/e' lateral: 8.7 LV IVS:        0.71 cm  LV e' medial:    8.05 cm/s LVOT diam:     2.00 cm  LV E/e' medial:  8.7 LVOT Area:     3.14 cm   RIGHT VENTRICLE             IVC RV S prime:     19.70 cm/s  IVC diam: 2.32 cm TAPSE (M-mode): 2.8 cm LEFT ATRIUM             Index       RIGHT ATRIUM           Index LA diam:        2.80 cm 1.50 cm/m  RA Area:     16.00 cm LA Vol (A2C):   35.9 ml 19.17 ml/m RA Volume:   41.20 ml  22.00 ml/m LA Vol (A4C):   35.0 ml 18.69 ml/m LA Biplane Vol: 35.1 ml 18.75 ml/m   AORTA Ao Root diam: 3.00 cm MITRAL VALVE               TRICUSPID VALVE MV Area (PHT): 2.39 cm    TR Peak grad:   39.9 mmHg MV Decel Time: 318 msec    TR Vmax:        316.00 cm/s MV E velocity: 70.00 cm/s MV A velocity: 93.00 cm/s  SHUNTS MV E/A ratio:  0.75        Systemic Diam: 2.00 cm Serafina Royals MD Electronically signed by Serafina Royals MD Signature Date/Time: 09/22/2019/7:30:23 PM    Final    CT RENAL STONE STUDY  Result Date: 09/12/2019 CLINICAL DATA:  LEFT flank and LEFT lower quadrant pain for 10-12 days, no history of kidney stones or surgery EXAM: CT ABDOMEN AND PELVIS WITHOUT CONTRAST TECHNIQUE: Multidetector CT imaging of the abdomen and pelvis was performed following the standard protocol without IV contrast. Sagittal and coronal MPR images reconstructed from axial data set. No oral contrast. COMPARISON:  None FINDINGS: Lower chest: Small LEFT pleural effusion with partial atelectasis of LEFT lower lobe. Hepatobiliary: Gallbladder and liver normal appearance Pancreas: Normal appearance Spleen: Normal appearance Adrenals/Urinary Tract: Adrenal glands, kidneys, ureters, and bladder normal appearance per Stomach/Bowel: Normal appendix. Minimally prominent stool in rectum. Large and small bowel loops otherwise unremarkable. No definite gastric abnormalities. Vascular/Lymphatic: Atherosclerotic calcifications aorta and iliac arteries without aneurysm. No adenopathy. Reproductive: Prostatic enlargement, gland measuring 4.7 x 4.3 cm image 72. Other: No free air or free fluid. No inflammatory process. Small RIGHT inguinal hernia containing  fat. Musculoskeletal: Small lucent focus within posterior aspect of L4 vertebral body RIGHT of midline, nonspecific. No additional osseous abnormalities. IMPRESSION: Small LEFT pleural effusion with partial atelectasis of LEFT lower lobe. Small RIGHT inguinal hernia  containing fat. Prostatic enlargement. No acute intra-abdominal or intrapelvic abnormalities. Small nonspecific lucent focus within the posterior aspect of the L4 vertebral body, nonspecific, cannot exclude myeloma or potentially lytic metastasis; correlation with workup for myeloma recommended. Aortic Atherosclerosis (ICD10-I70.0). Electronically Signed   By: Lavonia Dana M.D.   On: 09/12/2019 11:00   CT IMAGE GUIDED FLUID DRAIN BY CATHETER  Result Date: 09/23/2019 INDICATION: Complex left-sided pleural effusion. Request made for image guided placement of left-sided chest tube prior to potential definitive VATS. EXAM: CT IMAGE GUIDED FLUID DRAIN BY CATHETER COMPARISON:  Chest CT-earlier same day; 09/19/2019: Chest ultrasound-09/23/2019 MEDICATIONS: The patient is currently admitted to the hospital and receiving intravenous antibiotics. The antibiotics were administered within an appropriate time frame prior to the initiation of the procedure. ANESTHESIA/SEDATION: Moderate (conscious) sedation was employed during this procedure. A total of Versed 1 mg and Fentanyl 50 mcg was administered intravenously. Moderate Sedation Time: 25 minutes. The patient's level of consciousness and vital signs were monitored continuously by radiology nursing throughout the procedure under my direct supervision. CONTRAST:  None COMPLICATIONS: None immediate. PROCEDURE: Informed written consent was obtained from the patient after a discussion of the risks, benefits and alternatives to treatment. The patient was placed supine on the CT gantry and a pre procedural CT was performed re-demonstrating the known large partially loculated left-sided pleural effusion. The procedure  was planned. A timeout was performed prior to the initiation of the procedure. The skin overlying the inferolateral aspect the left lower chest was prepped and draped in the usual sterile fashion. The overlying soft tissues were anesthetized with 1% lidocaine with epinephrine. Appropriate trajectory was planned with the use of a 22 gauge spinal needle. An 18 gauge trocar needle was advanced into the abscess/fluid collection and a short Amplatz super stiff wire was coiled within the collection. Appropriate positioning was confirmed with a limited CT scan. The tract was serially dilated allowing placement of a 16 French all-purpose drainage catheter. Appropriate positioning was confirmed with a limited postprocedural CT scan. Approximately 650 cc ml of dark yellow fluid was aspirated. The tube was connected to a pleura vac device and sutured in place. A dressing was placed. The patient tolerated the procedure well without immediate post procedural complication. IMPRESSION: Successful CT guided placement of a 63 French all purpose drain catheter into the left pleural space with aspiration of 650 cc of dark yellow pleural fluid. Samples were sent to the laboratory as requested by the ordering clinical team. Electronically Signed   By: Sandi Mariscal M.D.   On: 09/23/2019 17:09      ASSESSMENT/PLAN   Left lung mass with associated hilar lymphadenopathy   - possible infectious vs malignant in etiology    - agree with empiric tx for CAP-zosyn   - s/p chest tube insertion - fluid cultures negative - serosang active draining without air leak    - will need interval imaging /PET to re-evaluate in 8 wks post recovery    - discussed and plan for bronchoscopic evaluation     -agree with workup including stre pneumoniae and legionella    - fungitell is in process    -procalcitonin is elevated at 4.92 -nasal MRSA in process   -leukocytosis has resolved       Left lung empyema and Atelectasis  -will add  recruitment maneuvers via Meta[facility] q8h with albuterol  and mucomyst 75ml bid 20% to regimen  -s/p Chest tube with active serosang low volume   -plan for repeat imaging  and removal of Chest tube  Thank you for allowing me to participate in the care of this patient.    Patient/Family are satisfied with care plan and all questions have been answered.  This document was prepared using Dragon voice recognition software and may include unintentional dictation errors.     Ottie Glazier, M.D.  Division of [facility]

## 2019-09-25 NOTE — Progress Notes (Signed)
PROGRESS NOTE  Alan Wolf EVO:[id number] DOB: Mar 23, 1950 DOA: 09/19/2019 PCP: Marguerita Merles, MD  HPI/Recap of past 24 hours: HPI from Dr Butch Penny McCandiesis a 69 y.o.malewithout significant past medical history, who presents with left flank pain. Patient states that he has been having left flank pain for almost 1 month.The pain is located in the left flank area, constant, sharp, moderate to severe, nonradiating. It is aggravated by movement or deep breath. Patient has mild cough and mild shortness of breath. Occasionally he coughs up streaks of pink-colored mucus. Denies fever or chills.He was initially seen and thought to have pneumonia on chest x-ray. He got 2 different antibiotics but is not any better.Patient does not have nausea,vomiting, diarrhea, abdominal pain, symptoms of UTI or unilateral weakness. Pt was seen in ED 09/12/19, and had CT-renal stone protocol which showedsmall LEFT pleural effusionand a small nonspecific lucent focus within the posterior aspect of the L4 vertebral body.  Further work-up with CTA chest showed lung mass.  Patient noted also to have intermittent fevers.  Patient admitted for further management.     Today, patient denies any new complaints, reports breathing is slightly improved.  Denied any worsening shortness of breath, chest pain, abdominal pain, nausea/vomiting, fever/chills.    Assessment/Plan: Principal Problem:   Mass of lower lobe of left lung Active Problems:   Leukocytosis   Left flank pain  Left lower lobe mass with associated hilar lymphadenopathy Left lung empyema Possible sepsis likely 2/2 community-acquired pneumonia Currently afebrile, with resolving leukocytosis Elevated procalcitonin CT chest showed 5.7 cm left lower lobe mass with bronchial adenopathy, unclear etiology, unable to exclude malignancy On 09/24/2019, patient had CT-guided chest tube placed after failed trial of thoracentesis, also had  intrapleural TPA to avoid having a VATS procedure Urine strep pneumo, Legionella both negative Peritoneal fluid culture showed no growth, BC x2 no growth to date Fungitell pending Continue DuoNebs, cough suppression, Mucomyst Continue Zosyn, completed azithromycin, will DC vancomycin for now as MRSA nares is negative Supplemental oxygen as needed CT surgery consulted, appreciate recs, continue chest tube, chest CT prior to removing tube  Acute hypoxic respiratory failure Likely 2/2 above Further management as above  Left flank pain Likely due to left lung mass Continue pain management       Malnutrition Type:      Malnutrition Characteristics:      Nutrition Interventions:       Estimated body mass index is 21.09 kg/m as calculated from the following:   Height as of this encounter: 5\' 11"  (1.803 m).   Weight as of this encounter: 68.6 kg.     Code Status: Full  Family Communication: Discussed with daughter at bedside on 09/25/2019  Disposition Plan: Status is: Inpatient  Remains inpatient appropriate because:Inpatient level of care appropriate due to severity of illness   Dispo: The patient is from: Home              Anticipated d/c is to: Home              Anticipated d/c date is: 2 days              Patient currently is not medically stable to d/c.    Consultants:  Pulmonology  CT surgery  Procedures:  Chest tube insertion  Antimicrobials:  Zosyn  Completed Azithromycin  D/C Vancomycin  DVT prophylaxis: SCD for now    Objective: Vitals:   09/24/19 2102 09/25/19 0505 09/25/19 0950 09/25/19 1159  BP:  126/69  133/76  Pulse:  76  87  Resp:  18  16  Temp:  98.8 F (37.1 C)  98.5 F (36.9 C)  TempSrc:  Oral  Oral  SpO2: 92% 94% 98% 94%  Weight:      Height:        Intake/Output Summary (Last 24 hours) at 09/25/2019 1609 Last data filed at 09/25/2019 1002 Gross per 24 hour  Intake 600 ml  Output 1010 ml  Net -410 ml   Filed  Weights   09/19/19 0458 09/19/19 2152  Weight: 70.3 kg 68.6 kg    Exam:  General: NAD   Cardiovascular: S1, S2 present  Respiratory:  Left-sided chest tube in place, noted serosanguineous drainage  Abdomen: Soft, nontender, nondistended, bowel sounds present  Musculoskeletal: No bilateral pedal edema noted  Skin: Normal  Psychiatry: Normal mood   Data Reviewed: CBC: Recent Labs  Lab 09/19/19 0518 09/19/19 0518 09/20/19 0452 09/21/19 0419 09/22/19 0336 09/23/19 0422 09/25/19 0431  WBC 11.7*   < > 24.2* 26.9* 21.8* 16.7* 11.1*  NEUTROABS 8.6*  --   --  23.9* 19.2* 13.7*  --   HGB 14.5   < > 14.1 12.9* 12.8* 12.7* 10.9*  HCT 42.3   < > 42.5 36.8* 38.0* 38.4* 32.7*  MCV 89.8   < > 90.2 88.2 89.8 90.4 89.6  PLT 332   < > 292 238 248 282 290   < > = values in this interval not displayed.   Basic Metabolic Panel: Recent Labs  Lab 09/21/19 0419 09/22/19 0336 09/23/19 0422 09/24/19 0415 09/25/19 0431  NA 136 137 135 137 140  K 4.2 3.6 3.4* 3.5 3.4*  CL 102 98 95* 97* 104  CO2 25 29 29 30 27   GLUCOSE 243* 224* 258* 194* 165*  BUN 18 19 22  28* 28*  CREATININE 1.24 1.32* 1.49* 1.47* 1.14  CALCIUM 8.3* 8.4* 8.4* 8.5* 8.3*  MG 1.9  --   --   --   --    GFR: Estimated Creatinine Clearance: 61 mL/min (by C-G formula based on SCr of 1.14 mg/dL). Liver Function Tests: Recent Labs  Lab 09/19/19 0518  AST 22  ALT 28  ALKPHOS 110  BILITOT 1.0  PROT 8.2*  ALBUMIN 3.7   No results for input(s): LIPASE, AMYLASE in the last 168 hours. No results for input(s): AMMONIA in the last 168 hours. Coagulation Profile: Recent Labs  Lab 09/19/19 1224  INR 1.1   Cardiac Enzymes: No results for input(s): CKTOTAL, CKMB, CKMBINDEX, TROPONINI in the last 168 hours. BNP (last 3 results) No results for input(s): PROBNP in the last 8760 hours. HbA1C: No results for input(s): HGBA1C in the last 72 hours. CBG: Recent Labs  Lab 09/19/19 0927  GLUCAP 119*   Lipid  Profile: No results for input(s): CHOL, HDL, LDLCALC, TRIG, CHOLHDL, LDLDIRECT in the last 72 hours. Thyroid Function Tests: No results for input(s): TSH, T4TOTAL, FREET4, T3FREE, THYROIDAB in the last 72 hours. Anemia Panel: No results for input(s): VITAMINB12, FOLATE, FERRITIN, TIBC, IRON, RETICCTPCT in the last 72 hours. Urine analysis: No results found for: COLORURINE, APPEARANCEUR, LABSPEC, PHURINE, GLUCOSEU, HGBUR, BILIRUBINUR, KETONESUR, PROTEINUR, UROBILINOGEN, NITRITE, LEUKOCYTESUR Sepsis Labs: @LABRCNTIP (procalcitonin:4,lacticidven:4)  ) Recent Results (from the past 240 hour(s))  SARS Coronavirus 2 by RT PCR (hospital order, performed in [facility] hospital lab) Nasopharyngeal Nasopharyngeal Swab     Status: None   Collection Time: 09/19/19  8:40 AM   Specimen: Nasopharyngeal Swab  Result Value Ref Range  Status   SARS Coronavirus 2 NEGATIVE NEGATIVE Final    Comment: (NOTE) SARS-CoV-2 target nucleic acids are NOT DETECTED.  The SARS-CoV-2 RNA is generally detectable in upper and lower respiratory specimens during the acute phase of infection. The lowest concentration of SARS-CoV-2 viral copies this assay can detect is 250 copies / mL. A negative result does not preclude SARS-CoV-2 infection and should not be used as the sole basis for treatment or other patient management decisions.  A negative result may occur with improper specimen collection / handling, submission of specimen other than nasopharyngeal swab, presence of viral mutation(s) within the areas targeted by this assay, and inadequate number of viral copies (<250 copies / mL). A negative result must be combined with clinical observations, patient history, and epidemiological information.  Fact Sheet for Patients:   StrictlyIdeas.no  Fact Sheet for Healthcare Providers: BankingDealers.co.za  This test is not yet approved or  cleared by the [place] FDA  and has been authorized for detection and/or diagnosis of SARS-CoV-2 by FDA under an Emergency Use Authorization (EUA).  This EUA will remain in effect (meaning this test can be used) for the duration of the COVID-19 declaration under Section 564(b)(1) of the Act, 21 U.S.C. section 360bbb-3(b)(1), unless the authorization is terminated or revoked sooner.  Performed at [facility], [address] [place], [place] [address]   CULTURE, BLOOD (ROUTINE X 2) w Reflex to ID Panel     Status: None   Collection Time: 09/19/19  6:34 PM   Specimen: BLOOD  Result Value Ref Range Status   Specimen Description BLOOD LEFT ANTECUBITAL  Final   Special Requests   Final    BOTTLES DRAWN AEROBIC AND ANAEROBIC Blood Culture adequate volume   Culture   Final    NO GROWTH 5 DAYS Performed at [facility], [address] [place], [place] [address]    Report Status 09/24/2019 FINAL  Final  CULTURE, BLOOD (ROUTINE X 2) w Reflex to ID Panel     Status: None   Collection Time: 09/19/19  6:34 PM   Specimen: BLOOD  Result Value Ref Range Status   Specimen Description BLOOD BLOOD LEFT HAND  Final   Special Requests   Final    BOTTLES DRAWN AEROBIC AND ANAEROBIC Blood Culture adequate volume   Culture   Final    NO GROWTH 5 DAYS Performed at [facility], [address], [place], [place] [address]    Report Status 09/24/2019 FINAL  Final  Expectorated sputum assessment w rflx to resp cult     Status: None   Collection Time: 09/20/19  5:23 AM   Specimen: Sputum  Result Value Ref Range Status   Specimen Description SPUTUM  Final   Special Requests NONE  Final   Sputum evaluation   Final    THIS SPECIMEN IS ACCEPTABLE FOR SPUTUM CULTURE Performed at [facility], [address] [place], [place] [address]    Report Status 09/20/2019 FINAL  Final  Culture, respiratory     Status: None   Collection Time: 09/20/19  5:23 AM   Specimen: SPU  Result Value Ref Range  Status   Specimen Description   Final    SPUTUM Performed at [facility], [address], [place], [place] [address]    Special Requests   Final    NONE Reflexed from [phone] Performed at [facility], [address], [place], [place] [address]    Gram Stain  Final    MODERATE WBC PRESENT,BOTH PMN AND MONONUCLEAR RARE SQUAMOUS EPITHELIAL CELLS PRESENT RARE GRAM POSITIVE COCCI IN PAIRS RARE GRAM POSITIVE RODS    Culture   Final    Consistent with normal respiratory flora. Performed at [facility], [place] [address], [place], [place] [address]    Report Status 09/22/2019 FINAL  Final  MRSA PCR Screening     Status: None   Collection Time: 09/20/19  1:34 PM   Specimen: Nasopharyngeal  Result Value Ref Range Status   MRSA by PCR NEGATIVE NEGATIVE Final    Comment:        The GeneXpert MRSA Assay (FDA approved for NASAL specimens only), is one component of a comprehensive MRSA colonization surveillance program. It is not intended to diagnose MRSA infection nor to guide or monitor treatment for MRSA infections. Performed at [facility], [address] [place], [place] [address]   Aerobic/Anaerobic Culture (surgical/deep wound)     Status: None (Preliminary result)   Collection Time: 09/23/19  4:59 PM   Specimen: Peritoneal Washings; Abscess  Result Value Ref Range Status   Specimen Description   Final    PERITONEAL LEFT FLUID Performed at [facility], [address], [place], [place] [address]    Special Requests   Final    NONE Performed at [facility], [address] [place], [place] [address]    Gram Stain   Final    MODERATE WBC PRESENT,BOTH PMN AND MONONUCLEAR NO ORGANISMS SEEN    Culture   Final    NO GROWTH 2 DAYS NO ANAEROBES ISOLATED; CULTURE IN PROGRESS FOR 5 DAYS Performed at [facility], [place] [address], [place], [place] [address]    Report Status PENDING  Incomplete      Studies: DG  Chest 2 View  Result Date: 09/25/2019 CLINICAL DATA:  Chest pain around chest tube site EXAM: CHEST - 2 VIEW COMPARISON:  09/24/2019 FINDINGS: Pigtail LEFT thoracostomy tube again identified. Normal heart size, mediastinal contours, and pulmonary vascularity. Atherosclerotic calcification aorta. Bibasilar small pleural effusions and atelectasis slightly greater on RIGHT, with significantly decreased LEFT pleural effusion since previous exam. Decreased infiltrate LEFT mid lung. No pneumothorax or acute osseous findings. IMPRESSION: Decreased LEFT pleural effusion and basilar atelectasis since prior study. Small residual bibasilar effusions and atelectasis. Improved LEFT midlung infiltrate. Electronically Signed   By: Lavonia Dana M.D.   On: 09/25/2019 08:24    Scheduled Meds: . acetylcysteine  4 mL Nebulization BID  . alteplase (tPA) 10mg  in NS 77mL for Dr.Oaks (intrapleural administration/[facility])   Intrapleural Once  . benzonatate  200 mg Oral TID  . gabapentin  300 mg Oral TID  . lidocaine  1 patch Transdermal Q24H  . mouth rinse  15 mL Mouth Rinse BID    Continuous Infusions: . sodium chloride Stopped (09/24/19 0145)  . piperacillin-tazobactam (ZOSYN)  IV 3.375 g (09/25/19 1525)  . [START ON 09/26/2019] vancomycin       LOS: 5 days     Alma Friendly, MD [facility]  If 7PM-7AM, please contact night-coverage [email or web address] 09/25/2019, 4:09 PM

## 2019-09-25 NOTE — Progress Notes (Signed)
Patient ID: Alan Wolf, male   DOB: 10/11/1950, 69 y.o.   MRN: [id number] [facility] Follow Up Note  Patient ID: Alan Wolf, male   DOB: [birth year], 69 y.o.   MRN: [id number]  HISTORY: Overall he feels better today.  He states that his breathing is much improved.    Vitals:   09/25/19 0950 09/25/19 1159  BP:  133/76  Pulse:  87  Resp:  16  Temp:  98.5 F (36.9 C)  SpO2: 98% 94%     EXAM:  Resp: Lungs are clear bilaterally.  No respiratory distress, normal effort. Heart:  Regular without murmurs Abd:  Abdomen is soft, non distended and non tender. No masses are palpable.  There is no rebound and no guarding.  Neurological: Alert and oriented to person, place, and time. Coordination normal.  Skin: Skin is warm and dry. No rash noted. No diaphoretic. No erythema. No pallor.  Psychiatric: Normal mood and affect. Normal behavior. Judgment and thought content normal.      ASSESSMENT: I have independently reviewed his chest x-ray.  There is been dramatic improvement in the left-sided pleural effusion.  There remains some opacification in the lower aspect of the left hemithorax.  Whether this is residual fluid or an intrapulmonic process is unclear.   PLAN:   We will leave the chest tube hooked to suction today.  It is draining a small amount of serosanguineous fluid.  We will repeat the chest x-ray tomorrow.  He understands that a chest CT is likely to be required prior to removing the drain.    Nestor Lewandowsky, MD

## 2019-09-26 ENCOUNTER — Inpatient Hospital Stay: Payer: Medicare HMO

## 2019-09-26 LAB — CBC
HCT: 32.3 % — ABNORMAL LOW (ref 39.0–52.0)
Hemoglobin: 10.9 g/dL — ABNORMAL LOW (ref 13.0–17.0)
MCH: 30.1 pg (ref 26.0–34.0)
MCHC: 33.7 g/dL (ref 30.0–36.0)
MCV: 89.2 fL (ref 80.0–100.0)
Platelets: 360 10*3/uL (ref 150–400)
RBC: 3.62 MIL/uL — ABNORMAL LOW (ref 4.22–5.81)
RDW: 13.1 % (ref 11.5–15.5)
WBC: 12.9 10*3/uL — ABNORMAL HIGH (ref 4.0–10.5)
nRBC: 0 % (ref 0.0–0.2)

## 2019-09-26 LAB — BASIC METABOLIC PANEL
Anion gap: 9 (ref 5–15)
BUN: 17 mg/dL (ref 8–23)
CO2: 24 mmol/L (ref 22–32)
Calcium: 8.4 mg/dL — ABNORMAL LOW (ref 8.9–10.3)
Chloride: 104 mmol/L (ref 98–111)
Creatinine, Ser: 1.04 mg/dL (ref 0.61–1.24)
GFR calc Af Amer: >60 mL/min (ref >60)
GFR calc non Af Amer: >60 mL/min (ref >60)
Glucose, Bld: 167 mg/dL — ABNORMAL HIGH (ref 70–99)
Potassium: 3.9 mmol/L (ref 3.5–5.1)
Sodium: 137 mmol/L (ref 135–145)

## 2019-09-26 LAB — FUNGITELL, SERUM: Fungitell Result: <31 pg/mL (ref <80)

## 2019-09-26 LAB — GLUCOSE, CAPILLARY: Glucose-Capillary: 267 mg/dL — ABNORMAL HIGH (ref 70–99)

## 2019-09-26 MED ORDER — SODIUM CHLORIDE 0.9 % IV SOLN
3.0000 g | Freq: Four times a day (QID) | INTRAVENOUS | Status: DC
  Administered 2019-09-26 – 2019-09-27 (×3): 3 g via INTRAVENOUS
  Filled 2019-09-26: qty 8
  Filled 2019-09-26 (×2): qty 3
  Filled 2019-09-26 (×3): qty 8
  Filled 2019-09-26: qty 3

## 2019-09-26 NOTE — Evaluation (Signed)
Occupational Therapy Evaluation Patient Details Name: Alan Wolf MRN: [id number] DOB: March 12, 1950 Today's Date: 09/26/2019    History of Present Illness 69 y.o. male without significant past medical history, who presents with left flank pain. Patient states that he has been having left flank pain for almost 1 month.  The pain is located in the left flank area, constant, sharp, moderate to severe, nonradiating.  It is aggravated by movement or deep breath.  Patient has mild cough and mild shortness of breath.  Occasionally he coughs up streaks of pink-colored mucus.  Denies fever or chills. He was initially seen and thought to have pneumonia on chest x-ray. He got 2 different antibiotics but is not any better.  Patient does not have nausea, vomiting, diarrhea, abdominal pain, symptoms of UTI or unilateral weakness. Pt was seen in ED 09/12/19, and had CT-renal stone protocol which showed small LEFT pleural effusion and a small nonspecific lucent focus within the posterior aspect of the L4 vertebral body.  Further work-up with CTA chest showed lung mass.  Patient noted also to have intermittent fevers.   Clinical Impression    Patient presenting with decreased I in self care, balance, functional mobility/transfers, endurance, and strength. Patient reports being independent and caring for wife with dementia PTA. 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  Patient currently functioning at supervision overall and fatigues quickly with standing tasks. OT began energy conservation education with pt and daughter. Education to continue.  Patient will benefit from acute OT to increase overall independence in the areas of ADLs, functional mobility, and endurance in order to safely discharge home with family.  Follow Up Recommendations  No OT follow up;Supervision - Intermittent    Equipment Recommendations  3 in 1 bedside commode    Recommendations for Other Services  Other (comment) (none at this time)     Precautions / Restrictions Precautions Precautions: Fall Precaution Comments: Chest tube      Mobility Bed Mobility Overal bed mobility: Independent       Transfers Overall transfer level: Needs assistance Equipment used: None Transfers: Sit to/from [employer] Transfers Sit to Stand: Supervision Stand pivot transfers: Supervision       General transfer comment: supervision for safety    Balance Overall balance assessment: Needs assistance Sitting-balance support: Feet supported Sitting balance-Leahy Scale: Normal     Standing balance support: During functional activity Standing balance-Leahy Scale: Good        ADL either performed or assessed with clinical judgement   ADL Overall ADL's : Needs assistance/impaired     General ADL Comments: supervision overall this session while standing with management of lines. Pt standing at sink for grooming tasks and then able to don pillow cases while standing. Pt fatigues very quickly with multiple rest breaks needed during self care tasks.     Vision Baseline Vision/History: No visual deficits Patient Visual Report: No change from baseline Vision Assessment?: No apparent visual deficits            Pertinent Vitals/Pain Pain Assessment: No/denies pain     Hand Dominance Right   Extremity/Trunk Assessment Upper Extremity Assessment Upper Extremity Assessment: Generalized weakness   Lower Extremity Assessment Lower Extremity Assessment: Defer to PT evaluation   Cervical / Trunk Assessment Cervical / Trunk Assessment: Normal   Communication Communication Communication: No difficulties   Cognition Arousal/Alertness: Awake/alert Behavior During Therapy: WFL for tasks assessed/performed Overall Cognitive Status: Within Functional Limits for tasks assessed  Home Living Family/patient expects to be discharged to:: Private residence Living  Arrangements: Spouse/significant other Available Help at Discharge: Family;Available PRN/intermittently Type of Home: House Home Access: Stairs to enter [employer] of Steps: 3-4 STE   Home Layout: One level     Bathroom Shower/Tub: Occupational psychologist: Standard Bathroom Accessibility: Yes   Home Equipment: None   Additional Comments: Pt is caregiver for his wife who has dementia. He reports, "she moves around a lot so I have to keep up with her". 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      Prior Functioning/Environment Level of Independence: Independent                 OT Problem List: Decreased strength;Decreased activity tolerance;Decreased safety awareness;Impaired balance (sitting and/or standing)      OT Treatment/Interventions: Self-care/ADL training;Therapeutic exercise;Therapeutic activities;Energy conservation;DME and/or AE instruction;Patient/family education    OT Goals(Current goals can be found in the care plan section) Acute Rehab OT Goals Patient Stated Goal: to go home so I can help my wife OT Goal Formulation: With patient Time For Goal Achievement: 10/10/19 Potential to Achieve Goals: Good ADL Goals Pt Will Perform Lower Body Dressing: with modified independence Pt Will Transfer to Toilet: with modified independence Pt Will Perform Toileting - Clothing Manipulation and hygiene: with modified independence  OT Frequency: Min 1X/week    AM-PAC OT "6 Clicks" Daily Activity     Outcome Measure Help from another person eating meals?: None Help from another person taking care of personal grooming?: None Help from another person toileting, which includes using toliet, bedpan, or urinal?: A Little Help from another person bathing (including washing, rinsing, drying)?: A Little Help from another person to put on and taking off regular upper body clothing?: A Little Help from another person  to put on and taking off regular lower body clothing?: A Little 6 Click Score: 20   End of Session Nurse Communication: Mobility status  Activity Tolerance: Patient tolerated treatment well Patient left: in chair;with call bell/phone within reach;with nursing/sitter in room;with family/visitor present  OT Visit Diagnosis: Muscle weakness (generalized) (M62.81)                Time: 1610-9604 OT Time Calculation (min): 21 min Charges:  OT General Charges $OT Visit: 1 Visit OT Evaluation $OT Eval Low Complexity: 1 Low OT Treatments $Self Care/Home Management : 8-22 mins  Darleen Crocker, MS, OTR/L , CBIS ascom [phone]  09/26/19, 11:44 AM

## 2019-09-26 NOTE — Progress Notes (Signed)
Alan Wolf Follow Up Note  Patient ID Alan Wolf, male   DOB: Aug 21, 1950, 69 y.o.   MRN: [id number]  HISTORY: Overall he feels much better but still has some pink colored sputum and some lower chest discomfort    Vitals:   09/25/19 2016 09/26/19 0349  BP:  (!) 146/73  Pulse:  91  Resp:  18  Temp:  100.3 F (37.9 C)  SpO2: 93% 92%     EXAM:  Resp: Lungs are clear bilaterally.  No respiratory distress, normal effort. Heart:  Regular without murmurs Abd:  Abdomen is soft, non distended and non tender. No masses are palpable.  There is no rebound and no guarding.  Neurological: Alert and oriented to person, place, and time. Coordination normal.  Skin: Skin is warm and dry. No rash noted. No diaphoretic. No erythema. No pallor.  Psychiatric: Normal mood and affect. Normal behavior. Judgment and thought content normal.    No air leak.  CT drained 70 cc of fluid (serous)  Independent review of Xray from today looks good  ASSESSMENT: Left sided empyema   PLAN:   Will order CT of chest to look at pleural space.  If there is no more fluid, we will remove the drain. Place to water seal    Alan Wolf, MDPatient ID: Alan Wolf, male   DOB: 09/14/1950, 69 y.o.   MRN: [id number]

## 2019-09-26 NOTE — Progress Notes (Signed)
PROGRESS NOTE  Alan Wolf UEA:[id number] DOB: [birth year] DOA: 09/19/2019 PCP: Marguerita Merles, MD  HPI/Recap of past 24 hours: HPI from Dr Butch Penny McCandiesis a 69 y.o.malewithout significant past medical history, who presents with left flank pain. Patient states that he has been having left flank pain for almost 1 month.The pain is located in the left flank area, constant, sharp, moderate to severe, nonradiating. It is aggravated by movement or deep breath. Patient has mild cough and mild shortness of breath. Occasionally he coughs up streaks of pink-colored mucus. Denies fever or chills.He was initially seen and thought to have pneumonia on chest x-ray. He got 2 different antibiotics but is not any better.Patient does not have nausea,vomiting, diarrhea, abdominal pain, symptoms of UTI or unilateral weakness. Pt was seen in ED 09/12/19, and had CT-renal stone protocol which showedsmall LEFT pleural effusionand a small nonspecific lucent focus within the posterior aspect of the L4 vertebral body.  Further work-up with CTA chest showed lung mass.  Patient noted also to have intermittent fevers.  Patient admitted for further management.     Today, patient reports feeling better, breathing has continuously improved, still with some coughing spells productive cough pink-colored sputum with associated chest discomfort.  Had a temp spike of 100.32F on 09/25/2019    Assessment/Plan: Principal Problem:   Mass of lower lobe of left lung Active Problems:   Leukocytosis   Left flank pain  Left lower lobe mass with chest discomfort hilar lymphadenopathy Left lung empyema Possible sepsis likely 2/2 community-acquired pneumonia Currently afebrile, with resolving leukocytosis Elevated procalcitonin CT chest showed 5.7 cm left lower lobe mass with bronchial adenopathy, unclear etiology, unable to exclude malignancy On 09/24/2019, patient had CT-guided chest tube placed after failed  trial of thoracentesis, also had intrapleural TPA to avoid having a VATS procedure Urine strep pneumo, Legionella both negative Peritoneal fluid culture showed no growth, BC x2 no growth to date Fungitell pending Continue DuoNebs, cough suppression, Mucomyst Narrow down antibiotics to IV Unasyn, completed azithromycin, DC Zosyn and vancomycin. Supplemental oxygen as needed CT surgery consulted, appreciate recs, continue chest tube, chest CT prior to removing tube  Acute hypoxic respiratory failure Likely 2/2 above Further management as above  Left flank pain Likely due to left lung mass Continue pain management  Noted hyperglycemia Will order A1c Daily CBGs pending A1c       Malnutrition Type:      Malnutrition Characteristics:      Nutrition Interventions:       Estimated body mass index is 21.09 kg/m as calculated from the following:   Height as of this encounter: 5\' 11"  (1.803 m).   Weight as of this encounter: 68.6 kg.     Code Status: Full  Family Communication: Discussed with daughter at bedside on 09/26/2019  Disposition Plan: Status is: Inpatient  Remains inpatient appropriate because:Inpatient level of care appropriate due to severity of illness   Dispo: The patient is from: Home              Anticipated d/c is to: Home              Anticipated d/c date is: 2 days              Patient currently is not medically stable to d/c.    Consultants:  Pulmonology  CT surgery  Procedures:  Chest tube insertion  Antimicrobials:  Unasyn  Completed Azithromycin  D/C Vancomycin, Zosyn  DVT prophylaxis: SCD for now  Objective: Vitals:   09/25/19 1710 09/25/19 1951 09/25/19 2016 09/26/19 0349  BP:  (!) 151/67  (!) 146/73  Pulse:  83  91  Resp:  18  18  Temp: (!) 100.9 F (38.3 C) 98.4 F (36.9 C)  100.3 F (37.9 C)  TempSrc: Oral Oral  Oral  SpO2:  95% 93% 92%  Weight:      Height:        Intake/Output Summary (Last 24  hours) at 09/26/2019 1654 Last data filed at 09/26/2019 0900 Gross per 24 hour  Intake 1113.03 ml  Output --  Net 1113.03 ml   Filed Weights   09/19/19 0458 09/19/19 2152  Weight: 70.3 kg 68.6 kg    Exam:  General: NAD   Cardiovascular: S1, S2 present  Respiratory:  Left-sided chest tube in place, noted serosanguineous drainage  Abdomen: Soft, nontender, nondistended, bowel sounds present  Musculoskeletal: No bilateral pedal edema noted  Skin: Normal  Psychiatry: Normal mood   Data Reviewed: CBC: Recent Labs  Lab 09/21/19 0419 09/22/19 0336 09/23/19 0422 09/25/19 0431 09/26/19 0536  WBC 26.9* 21.8* 16.7* 11.1* 12.9*  NEUTROABS 23.9* 19.2* 13.7*  --   --   HGB 12.9* 12.8* 12.7* 10.9* 10.9*  HCT 36.8* 38.0* 38.4* 32.7* 32.3*  MCV 88.2 89.8 90.4 89.6 89.2  PLT 238 248 282 290 637   Basic Metabolic Panel: Recent Labs  Lab 09/21/19 0419 09/21/19 0419 09/22/19 0336 09/23/19 0422 09/24/19 0415 09/25/19 0431 09/26/19 0536  NA 136   < > 137 135 137 140 137  K 4.2   < > 3.6 3.4* 3.5 3.4* 3.9  CL 102   < > 98 95* 97* 104 104  CO2 25   < > 29 29 30 27 24   GLUCOSE 243*   < > 224* 258* 194* 165* 167*  BUN 18   < > 19 22 28* 28* 17  CREATININE 1.24   < > 1.32* 1.49* 1.47* 1.14 1.04  CALCIUM 8.3*   < > 8.4* 8.4* 8.5* 8.3* 8.4*  MG 1.9  --   --   --   --   --   --    < > = values in this interval not displayed.   GFR: Estimated Creatinine Clearance: 66.9 mL/min (by C-G formula based on SCr of 1.04 mg/dL). Liver Function Tests: No results for input(s): AST, ALT, ALKPHOS, BILITOT, PROT, ALBUMIN in the last 168 hours. No results for input(s): LIPASE, AMYLASE in the last 168 hours. No results for input(s): AMMONIA in the last 168 hours. Coagulation Profile: No results for input(s): INR, PROTIME in the last 168 hours. Cardiac Enzymes: No results for input(s): CKTOTAL, CKMB, CKMBINDEX, TROPONINI in the last 168 hours. BNP (last 3 results) No results for input(s):  PROBNP in the last 8760 hours. HbA1C: No results for input(s): HGBA1C in the last 72 hours. CBG: Recent Labs  Lab 09/26/19 1559  GLUCAP 267*   Lipid Profile: No results for input(s): CHOL, HDL, LDLCALC, TRIG, CHOLHDL, LDLDIRECT in the last 72 hours. Thyroid Function Tests: No results for input(s): TSH, T4TOTAL, FREET4, T3FREE, THYROIDAB in the last 72 hours. Anemia Panel: No results for input(s): VITAMINB12, FOLATE, FERRITIN, TIBC, IRON, RETICCTPCT in the last 72 hours. Urine analysis: No results found for: COLORURINE, APPEARANCEUR, LABSPEC, PHURINE, GLUCOSEU, HGBUR, BILIRUBINUR, KETONESUR, PROTEINUR, UROBILINOGEN, NITRITE, LEUKOCYTESUR Sepsis Labs: @LABRCNTIP (procalcitonin:4,lacticidven:4)  ) Recent Results (from the past 240 hour(s))  SARS Coronavirus 2 by RT PCR (hospital order, performed in [facility] hospital  lab) Nasopharyngeal Nasopharyngeal Swab     Status: None   Collection Time: 09/19/19  8:40 AM   Specimen: Nasopharyngeal Swab  Result Value Ref Range Status   SARS Coronavirus 2 NEGATIVE NEGATIVE Final    Comment: (NOTE) SARS-CoV-2 target nucleic acids are NOT DETECTED.  The SARS-CoV-2 RNA is generally detectable in upper and lower respiratory specimens during the acute phase of infection. The lowest concentration of SARS-CoV-2 viral copies this assay can detect is 250 copies / mL. A negative result does not preclude SARS-CoV-2 infection and should not be used as the sole basis for treatment or other patient management decisions.  A negative result may occur with improper specimen collection / handling, submission of specimen other than nasopharyngeal swab, presence of viral mutation(s) within the areas targeted by this assay, and inadequate number of viral copies (<250 copies / mL). A negative result must be combined with clinical observations, patient history, and epidemiological information.  Fact Sheet for Patients:    StrictlyIdeas.no  Fact Sheet for Healthcare Providers: BankingDealers.co.za  This test is not yet approved or  cleared by the [place] FDA and has been authorized for detection and/or diagnosis of SARS-CoV-2 by FDA under an Emergency Use Authorization (EUA).  This EUA will remain in effect (meaning this test can be used) for the duration of the COVID-19 declaration under Section 564(b)(1) of the Act, 21 U.S.C. section 360bbb-3(b)(1), unless the authorization is terminated or revoked sooner.  Performed at [facility], [address] [place], [place] [address]   CULTURE, BLOOD (ROUTINE X 2) w Reflex to ID Panel     Status: None   Collection Time: 09/19/19  6:34 PM   Specimen: BLOOD  Result Value Ref Range Status   Specimen Description BLOOD LEFT ANTECUBITAL  Final   Special Requests   Final    BOTTLES DRAWN AEROBIC AND ANAEROBIC Blood Culture adequate volume   Culture   Final    NO GROWTH 5 DAYS Performed at [facility], [address] [place], [place] [address]    Report Status 09/24/2019 FINAL  Final  CULTURE, BLOOD (ROUTINE X 2) w Reflex to ID Panel     Status: None   Collection Time: 09/19/19  6:34 PM   Specimen: BLOOD  Result Value Ref Range Status   Specimen Description BLOOD BLOOD LEFT HAND  Final   Special Requests   Final    BOTTLES DRAWN AEROBIC AND ANAEROBIC Blood Culture adequate volume   Culture   Final    NO GROWTH 5 DAYS Performed at [facility], [address], [place], [place] [address]    Report Status 09/24/2019 FINAL  Final  Expectorated sputum assessment w rflx to resp cult     Status: None   Collection Time: 09/20/19  5:23 AM   Specimen: Sputum  Result Value Ref Range Status   Specimen Description SPUTUM  Final   Special Requests NONE  Final   Sputum evaluation   Final    THIS SPECIMEN IS ACCEPTABLE FOR SPUTUM CULTURE Performed at [facility], [address] [place], [place] [address]    Report Status 09/20/2019 FINAL  Final  Culture, respiratory     Status: None   Collection Time: 09/20/19  5:23 AM   Specimen: SPU  Result Value Ref Range Status   Specimen Description   Final    SPUTUM Performed at [facility], [address], [place],  [address]    Special Requests  Final    NONE Reflexed from [id number] Performed at [facility], [place], [place] [address]    Gram Stain   Final    MODERATE WBC PRESENT,BOTH PMN AND MONONUCLEAR RARE SQUAMOUS EPITHELIAL CELLS PRESENT RARE GRAM POSITIVE COCCI IN PAIRS RARE GRAM POSITIVE RODS    Culture   Final    Consistent with normal respiratory flora. Performed at [facility], [place] [address], [place], [place] [address]    Report Status 09/22/2019 FINAL  Final  MRSA PCR Screening     Status: None   Collection Time: 09/20/19  1:34 PM   Specimen: Nasopharyngeal  Result Value Ref Range Status   MRSA by PCR NEGATIVE NEGATIVE Final    Comment:        The GeneXpert MRSA Assay (FDA approved for NASAL specimens only), is one component of a comprehensive MRSA colonization surveillance program. It is not intended to diagnose MRSA infection nor to guide or monitor treatment for MRSA infections. Performed at [facility], [address] [place], [place] [address]   Aerobic/Anaerobic Culture (surgical/deep wound)     Status: None (Preliminary result)   Collection Time: 09/23/19  4:59 PM   Specimen: Peritoneal Washings; Abscess  Result Value Ref Range Status   Specimen Description   Final    PERITONEAL LEFT FLUID Performed at [facility], [address] [place], [place] [address]    Special Requests   Final    NONE Performed at [facility], [place], [place] [address]    Gram Stain   Final    MODERATE WBC PRESENT,BOTH PMN AND MONONUCLEAR NO ORGANISMS SEEN    Culture   Final    NO GROWTH 3  DAYS NO ANAEROBES ISOLATED; CULTURE IN PROGRESS FOR 5 DAYS Performed at [facility], [address], [place],  [address]    Report Status PENDING  Incomplete      Studies: DG Chest 2 View  Result Date: 09/26/2019 CLINICAL DATA:  Chest tube EXAM: CHEST - 2 VIEW COMPARISON:  09/25/2019 FINDINGS: Left chest tube remains present with pigtail in similar position. Similar appearance of left mid and lower lung opacity. Similar residual small pleural effusions and bibasilar atelectasis. No pneumothorax. Stable cardiomediastinal contours. No acute osseous abnormality. IMPRESSION: No substantial change since 09/25/2019. Electronically Signed   By: Macy Mis M.D.   On: 09/26/2019 08:06    Scheduled Meds: . alteplase (tPA) 10mg  in NS 90mL for Dr.Oaks (intrapleural administration/[facility])   Intrapleural Once  . benzonatate  200 mg Oral TID  . gabapentin  300 mg Oral TID  . lidocaine  1 patch Transdermal Q24H  . mouth rinse  15 mL Mouth Rinse BID    Continuous Infusions: . sodium chloride Stopped (09/25/19 0527)  . [START ON 09/27/2019] ampicillin-sulbactam (UNASYN) IV    . piperacillin-tazobactam (ZOSYN)  IV 3.375 g (09/26/19 1510)     LOS: 6 days     Alma Friendly, MD [facility]  If 7PM-7AM, please contact night-coverage [email or web address] 09/26/2019, 4:54 PM

## 2019-09-26 NOTE — Care Management Important Message (Signed)
Important Message  Patient Details  Name: Alan Wolf MRN: [id number] Date of Birth: 17-Jul-1950   Medicare Important Message Given:  Yes     Juliann Pulse A Salam [place] 09/26/2019, 11:43 AM

## 2019-09-26 NOTE — Progress Notes (Signed)
Pulmonary Medicine          Date: 09/26/2019,   MRN# [id number] Geovanny Sartin 11/11/1950     AdmissionWeight: 70.3 kg                 CurrentWeight: 68.6 kg  Referring physician: Dr Blaine Hamper    CHIEF COMPLAINT:   Lung Mass of left lowe lobe with hilar lymphadenopathy.    SUBJECTIVE   Patient is clinically improved. He is eating well, had normal BM this morning.  He is breathing better with clear lung sounds bilaterally on exam.    CXR this am reviewed , atalectasis at left base, will increase BPH to MetaNEB with saline q4h to optimize respiratory status in preparation for chest tube removal.   Occupational therapy this morning , patient did well.   Daughter at bedside during my evaluation we discussed post d/c follow up and potential bronchoscopic evaluation if residual mass is identified post treatment of pneumonia.   PAST MEDICAL HISTORY   History reviewed. No pertinent past medical history.   SURGICAL HISTORY   History reviewed. No pertinent surgical history.   FAMILY HISTORY   Family History  Problem Relation Age of Onset  . Diabetes Mellitus II Mother   . Lung cancer Brother      SOCIAL HISTORY   Social History   Tobacco Use  . Smoking status: Never Smoker  . Smokeless tobacco: Never Used  Substance Use Topics  . Alcohol use: Never  . Drug use: Never     MEDICATIONS    Home Medication:    Current Medication:  Current Facility-Administered Medications:  .  0.9 %  sodium chloride infusion, , Intravenous, PRN, Sidney Ace, MD, Stopped at 09/25/19 [phone] .  acetaminophen (TYLENOL) tablet 650 mg, 650 mg, Oral, Q6H PRN, [name], Sudheer B, MD, 650 mg at 09/25/19 1714 .  acetylcysteine (MUCOMYST) 20 % nebulizer / oral solution 4 mL, 4 mL, Nebulization, BID, Lanney Gins, Esdras Delair, MD, 4 mL at 09/25/19 2016 .  albuterol (PROVENTIL) (2.5 MG/3ML) 0.083% nebulizer solution 2.5 mg, 2.5 mg, Nebulization, Q4H PRN, [name], MD, 2.5 mg at  09/25/19 2013 .  alteplase (tPA) 10mg  in NS 69mL for Dr.Oaks (intrapleural administration/[facility]), , Intrapleural, Once, Tylene Fantasia, PA-C .  benzonatate (TESSALON) capsule 200 mg, 200 mg, Oral, TID, [name], Sudheer B, MD, 200 mg at 09/25/19 2103 .  diphenhydrAMINE (BENADRYL) capsule 25 mg, 25 mg, Oral, Q6H PRN, Sreenath, Sudheer B, MD .  gabapentin (NEURONTIN) tablet 300 mg, 300 mg, Oral, TID, [name], Sudheer B, MD, 300 mg at 09/25/19 2103 .  lidocaine (LIDODERM) 5 % 1 patch, 1 patch, Transdermal, Q24H, Sreenath, Sudheer B, MD, 1 patch at 09/25/19 1526 .  MEDLINE mouth rinse, 15 mL, Mouth Rinse, BID, Sreenath, Sudheer B, MD, 15 mL at 09/25/19 0914 .  morphine 2 MG/ML injection 2 mg, 2 mg, Intravenous, Q3H PRN, [name], Sudheer B, MD, 2 mg at 09/23/19 1052 .  ondansetron (ZOFRAN) injection 4 mg, 4 mg, Intravenous, Q8H PRN, [name], MD .  oxyCODONE-acetaminophen (PERCOCET/ROXICET) 5-325 MG per tablet 1 tablet, 1 tablet, Oral, Q4H PRN, [name], MD, 1 tablet at 09/26/19 0353 .  piperacillin-tazobactam (ZOSYN) IVPB 3.375 g, 3.375 g, Intravenous, Q8H, Shanlever, Pierce Crane, RPH, Last Rate: 12.5 mL/hr at 09/26/19 0520, 3.375 g at 09/26/19 0520 .  promethazine-codeine (PHENERGAN with CODEINE) 6.25-10 MG/5ML syrup 5 mL, 5 mL, Oral, Q6H PRN, [name], Trula Slade, MD    ALLERGIES   Penicillins  REVIEW OF SYSTEMS    Review of Systems:  Gen:  Denies  fever, sweats, chills weigh loss  HEENT: Denies blurred vision, double vision, ear pain, eye pain, hearing loss, nose bleeds, sore throat Cardiac:  No dizziness, chest pain or heaviness, chest tightness,edema Resp:   Denies cough or sputum porduction, shortness of breath,wheezing, hemoptysis,  Gi: Denies swallowing difficulty, stomach pain, nausea or vomiting, diarrhea, constipation, bowel incontinence Gu:  Denies bladder incontinence, burning urine Ext:   Denies Joint pain, stiffness or swelling Skin: Denies  skin rash, easy  bruising or bleeding or hives Endoc:  Denies polyuria, polydipsia , polyphagia or weight change Psych:   Denies depression, insomnia or hallucinations   Other:  All other systems negative   VS: BP (!) 146/73 (BP Location: Left Arm)   Pulse 91   Temp 100.3 F (37.9 C) (Oral)   Resp 18   Ht 5\' 11"  (1.803 m)   Wt 68.6 kg   SpO2 92%   BMI 21.09 kg/m      PHYSICAL EXAM    GENERAL:NAD, no fevers, chills, no weakness no fatigue HEAD: Normocephalic, atraumatic.  EYES: Pupils equal, round, reactive to light. Extraocular muscles intact. No scleral icterus.  MOUTH: Moist mucosal membrane. Dentition intact. No abscess noted.  EAR, NOSE, THROAT: Clear without exudates. No external lesions.  NECK: Supple. No thyromegaly. No nodules. No JVD.  PULMONARY: clear to auscultation with minimal rhonchi at left lung CARDIOVASCULAR: S1 and S2. Regular rate and rhythm. No murmurs, rubs, or gallops. No edema. Pedal pulses 2+ bilaterally.  GASTROINTESTINAL: Soft, nontender, nondistended. No masses. Positive bowel sounds. No hepatosplenomegaly.  MUSCULOSKELETAL: No swelling, clubbing, or edema. Range of motion full in all extremities.  NEUROLOGIC: Cranial nerves II through XII are intact. No gross focal neurological deficits. Sensation intact. Reflexes intact.  SKIN: No ulceration, lesions, rashes, or cyanosis. Skin warm and dry. Turgor intact.  PSYCHIATRIC: Mood, affect within normal limits. The patient is awake, alert and oriented x 3. Insight, judgment intact.       IMAGING    DG Chest 1 View  Result Date: 09/21/2019 CLINICAL DATA:  69 year old with progressive shortness of breath with exertion over night and acute respiratory failure. Fever and cough. EXAM: Portable CHEST 1 VIEW COMPARISON:  CTA chest 09/19/2019. FINDINGS: Since the CT 2 days ago, marked increase in size of the now very large LEFT pleural effusion. Worsening dense consolidation in the LEFT LOWER LOBE and lingula. New small RIGHT  pleural effusion and associated mild passive atelectasis in the RIGHT LOWER LOBE. RIGHT lung remains clear otherwise. Heart moderately enlarged. Pulmonary vascularity normal. IMPRESSION: 1. Marked increase in size of the now large LEFT pleural effusion since the CT 2 days ago. 2. Worsening dense atelectasis and/or pneumonia in the LEFT LOWER LOBE and lingula. 3. New small RIGHT pleural effusion and associated mild passive atelectasis in the RIGHT LOWER LOBE. Electronically Signed   By: Evangeline Dakin M.D.   On: 09/21/2019 12:49   DG Chest 2 View  Result Date: 09/26/2019 CLINICAL DATA:  Chest tube EXAM: CHEST - 2 VIEW COMPARISON:  09/25/2019 FINDINGS: Left chest tube remains present with pigtail in similar position. Similar appearance of left mid and lower lung opacity. Similar residual small pleural effusions and bibasilar atelectasis. No pneumothorax. Stable cardiomediastinal contours. No acute osseous abnormality. IMPRESSION: No substantial change since 09/25/2019. Electronically Signed   By: Macy Mis M.D.   On: 09/26/2019 08:06   DG Chest 2 View  Result Date: 09/25/2019  CLINICAL DATA:  Chest pain around chest tube site EXAM: CHEST - 2 VIEW COMPARISON:  09/24/2019 FINDINGS: Pigtail LEFT thoracostomy tube again identified. Normal heart size, mediastinal contours, and pulmonary vascularity. Atherosclerotic calcification aorta. Bibasilar small pleural effusions and atelectasis slightly greater on RIGHT, with significantly decreased LEFT pleural effusion since previous exam. Decreased infiltrate LEFT mid lung. No pneumothorax or acute osseous findings. IMPRESSION: Decreased LEFT pleural effusion and basilar atelectasis since prior study. Small residual bibasilar effusions and atelectasis. Improved LEFT midlung infiltrate. Electronically Signed   By: Lavonia Dana M.D.   On: 09/25/2019 08:24   CT CHEST W CONTRAST  Result Date: 09/23/2019 CLINICAL DATA:  70 year old male with abnormal x-ray. Pleural  effusion. EXAM: CT CHEST WITH CONTRAST TECHNIQUE: Multidetector CT imaging of the chest was performed during intravenous contrast administration. CONTRAST:  75mL OMNIPAQUE IOHEXOL 300 MG/ML  SOLN COMPARISON:  Chest CT dated 09/19/2019 and radiograph dated 09/22/2019. FINDINGS: Cardiovascular: There is no cardiomegaly or pericardial effusion. Coronary vascular calcification primarily involving the LAD. The thoracic aorta is unremarkable. The origins of the great vessels of the aortic arch are patent as visualized. Evaluation of the pulmonary arteries is limited due to suboptimal visualization of the distal branches. No large or central pulmonary artery embolus identified. Mediastinum/Nodes: Mildly enlarged right hilar lymph node measures 12 mm in short axis. Subcarinal lymph node measures 11 mm. The esophagus is grossly unremarkable. No mediastinal fluid collection. Lungs/Pleura: There is a large left pleural effusion with probable areas of loculation. There is compressive atelectasis of the majority of the left lung with a small aerated portion of the left upper lobe. Small pockets of air within the effusion inferiorly may be related to recent procedure or represent an infectious process. A bronchopleural fistula is not excluded. There is a small right pleural effusion. Partial consolidative changes involving the right lung base and right middle lobe may represent atelectasis or infiltrate. Smaller ground-glass clusters in the right upper lobe may represent pneumonia. There is no pneumothorax. The central airways are patent. Upper Abdomen: No acute abnormality. Musculoskeletal: No chest wall abnormality. No acute or significant osseous findings. IMPRESSION: 1. No central pulmonary artery embolus identified. 2. Large left pleural effusion with consolidation of the majority of the left lung. Small pockets of air within the effusion inferiorly may be related to recent procedure or represent an infectious process. A  bronchopleural fistula is not excluded. 3. Small right pleural effusion. 4. Partial consolidative changes involving the right lung base and right middle lobe may represent atelectasis or infiltrate. Smaller ground-glass clusters in the right upper lobe may represent pneumonia. Clinical correlation and follow-up recommended. 5. Mildly enlarged right hilar and subcarinal lymph nodes, likely reactive. 6. Aortic Atherosclerosis (ICD10-I70.0). Electronically Signed   By: Anner Crete M.D.   On: 09/23/2019 15:46   CT Angio Chest PE W and/or Wo Contrast  Result Date: 09/19/2019 CLINICAL DATA:  Chest pain with pleurisy or effusion suspected EXAM: CT ANGIOGRAPHY CHEST WITH CONTRAST TECHNIQUE: Multidetector CT imaging of the chest was performed using the standard protocol during bolus administration of intravenous contrast. Multiplanar CT image reconstructions and MIPs were obtained to evaluate the vascular anatomy. CONTRAST:  76mL OMNIPAQUE IOHEXOL 350 MG/ML SOLN COMPARISON:  Abdominal CT 09/12/2019 FINDINGS: Cardiovascular: Normal heart size. No pericardial effusion. Atheromatous calcification along the aorta and left main coronary. Mediastinum/Nodes: Mildly enlarged bronchial lymph nodes the left lower lobe, 11 mm in diameter. Lungs/Pleura: Ovoid area of decreased enhancement and vessel attenuation measuring up to 5.7 cm on  axial slices. There is adjacent enhancing compressed appearing lung. Small left pleural effusion with some scalloping. Upper Abdomen: Negative Musculoskeletal: Negative for bone erosion. No evidence of osseous metastatic disease. Review of the MIP images confirms the above findings. IMPRESSION: 1. Concern for left lower lobe mass with bronchial adenopathy. Recommend referral to multi disciplinary thoracic oncology. Findings are not definitive for malignancy and could be related to round atelectasis or atypical pneumonia, consider antibiotic therapy in the meantime. 2. Small left pleural effusion  complicated by areas of loculation. 3. Negative for pulmonary embolism. Electronically Signed   By: Monte Fantasia M.D.   On: 09/19/2019 06:44   [place] CHEST (PLEURAL EFFUSION)  Result Date: 09/23/2019 CLINICAL DATA:  Concern for worsening left-sided pleural effusion. EXAM: CHEST ULTRASOUND COMPARISON:  Chest CT-09/19/2019; chest radiograph-09/22/2019 FINDINGS: Sonographic evaluation of the left hemithorax demonstrates apparent dense consolidation of the left lung (favored) versus densely loculated pleural fluid, not amenable to ultrasound-guided thoracentesis. No thoracentesis attempted. IMPRESSION: Dense consolidation of the left lung (favored) versus densely loculated pleural fluid, not amenable to ultrasound-guided thoracentesis. No thoracentesis attempted. Recommend proceeding with repeat contrast-enhanced chest CT for further evaluation. Critical Value/emergent results were relayed to referring physician via epic messenger at the time of interpretation on 09/23/2019 at 10:37 am to provider [facility] , who acknowledged these results. Electronically Signed   By: Sandi Mariscal M.D.   On: 09/23/2019 10:38   US Venous Img Lower Bilateral (DVT)  Result Date: 09/19/2019 CLINICAL DATA:  Elevated D-dimer.  Evaluate for DVT. EXAM: BILATERAL LOWER EXTREMITY VENOUS DOPPLER ULTRASOUND TECHNIQUE: Gray-scale sonography with graded compression, as well as color Doppler and duplex ultrasound were performed to evaluate the lower extremity deep venous systems from the level of the common femoral vein and including the common femoral, femoral, profunda femoral, popliteal and calf veins including the posterior tibial, peroneal and gastrocnemius veins when visible. The superficial great saphenous vein was also interrogated. Spectral Doppler was utilized to evaluate flow at rest and with distal augmentation maneuvers in the common femoral, femoral and popliteal veins. COMPARISON:  None. FINDINGS: RIGHT LOWER EXTREMITY Common  Femoral Vein: No evidence of thrombus. Normal compressibility, respiratory phasicity and response to augmentation. Saphenofemoral Junction: No evidence of thrombus. Normal compressibility and flow on color Doppler imaging. Profunda Femoral Vein: No evidence of thrombus. Normal compressibility and flow on color Doppler imaging. Femoral Vein: No evidence of thrombus. Normal compressibility, respiratory phasicity and response to augmentation. Popliteal Vein: No evidence of thrombus. Normal compressibility, respiratory phasicity and response to augmentation. Calf Veins: No evidence of thrombus. Normal compressibility and flow on color Doppler imaging. Superficial Great Saphenous Vein: No evidence of thrombus. Normal compressibility. Venous Reflux:  None. Other Findings:  None. LEFT LOWER EXTREMITY Common Femoral Vein: No evidence of thrombus. Normal compressibility, respiratory phasicity and response to augmentation. Saphenofemoral Junction: No evidence of thrombus. Normal compressibility and flow on color Doppler imaging. Profunda Femoral Vein: No evidence of thrombus. Normal compressibility and flow on color Doppler imaging. Femoral Vein: No evidence of thrombus. Normal compressibility, respiratory phasicity and response to augmentation. Popliteal Vein: No evidence of thrombus. Normal compressibility, respiratory phasicity and response to augmentation. Calf Veins: No evidence of thrombus. Normal compressibility and flow on color Doppler imaging. Superficial Great Saphenous Vein: No evidence of thrombus. Normal compressibility. Venous Reflux:  None. Other Findings:  None. IMPRESSION: No evidence of DVT within either lower extremity. Electronically Signed   By: Sandi Mariscal M.D.   On: 09/19/2019 12:29   DG Chest [facility]  1 View  Result Date: 09/24/2019 CLINICAL DATA:  Chest tube placement EXAM: PORTABLE CHEST 1 VIEW COMPARISON:  09/22/2019 FINDINGS: A left chest tube has been place with evacuation of much of the left  pleural effusion. A moderate-sized effusion remains. Residual consolidation or atelectasis in the left lung. Linear atelectasis in the right lung base. Heart size is partly obscured but appears normal. IMPRESSION: Interval placement of left chest tube with evacuation of much of the left pleural effusion. Residual consolidation or atelectasis in the left lung. Electronically Signed   By: Lucienne Capers M.D.   On: 09/24/2019 03:24   DG Chest Port 1 View  Result Date: 09/22/2019 CLINICAL DATA:  Pleural effusion. EXAM: PORTABLE CHEST 1 VIEW COMPARISON:  Chest x-ray dated 09/21/2019. FINDINGS: Near complete opacification of the LEFT hemithorax, stable to slightly increased compared to yesterday's chest x-ray. Probable small RIGHT pleural effusion and/or RIGHT basilar atelectasis. RIGHT lung remains otherwise clear. No pneumothorax is seen. Cardiomediastinal size and position is grossly stable. IMPRESSION: 1. Near complete opacification of the LEFT hemithorax, stable to slightly increased compared to yesterday's chest x-ray, significantly increased compared to the chest CT angiogram of 09/19/2019. This is presumably a combination of large pleural effusion and LEFT lower lung pneumonia versus atelectasis. 2. Probable small pleural effusion and/or atelectasis at the RIGHT lung base. Electronically Signed   By: Franki Cabot M.D.   On: 09/22/2019 12:09   ECHOCARDIOGRAM COMPLETE  Result Date: 09/22/2019    ECHOCARDIOGRAM REPORT   Patient Name:   JAMESEN STAHNKE Date of Exam: 09/22/2019 Medical Rec #:  [id number]        Height:       71.0 in Accession #:    [id number]       Weight:       151.2 lb Date of Birth:  08-04-1950        BSA:          1.872 m Patient Age:    69 years         BP:           120/69 mmHg Patient Gender: M                HR:           94 bpm. Exam Location:  [facility] Procedure: 2D Echo Indications:     CHF-Acute Diastolic 829.56/O13.08  History:         Patient has no prior history of Echocardiogram  examinations.  Sonographer:     Avanell Shackleton Referring Phys:  [id number] Sidney Ace Diagnosing Phys: Serafina Royals MD IMPRESSIONS  1. Left ventricular ejection fraction, by estimation, is 65 to 70%. The left ventricle has normal function. The left ventricle has no regional wall motion abnormalities. Left ventricular diastolic parameters were normal.  2. Right ventricular systolic function is normal. The right ventricular size is normal. There is moderately elevated pulmonary artery systolic pressure.  3. The mitral valve is normal in structure. Trivial mitral valve regurgitation.  4. The aortic valve is normal in structure. Aortic valve regurgitation is trivial. FINDINGS  Left Ventricle: Left ventricular ejection fraction, by estimation, is 65 to 70%. The left ventricle has normal function. The left ventricle has no regional wall motion abnormalities. The left ventricular internal cavity size was small. There is no left ventricular hypertrophy. Left ventricular diastolic parameters were normal. Right Ventricle: The right ventricular size is normal. No increase in right ventricular wall thickness. Right ventricular systolic function is normal. There  is moderately elevated pulmonary artery systolic pressure. The tricuspid regurgitant velocity is 3.16 m/s, and with an assumed right atrial pressure of 10 mmHg, the estimated right ventricular systolic pressure is 74.2 mmHg. Left Atrium: Left atrial size was normal in size. Right Atrium: Right atrial size was normal in size. Pericardium: There is no evidence of pericardial effusion. Mitral Valve: The mitral valve is normal in structure. Trivial mitral valve regurgitation. Tricuspid Valve: The tricuspid valve is normal in structure. Tricuspid valve regurgitation is not demonstrated. Aortic Valve: The aortic valve is normal in structure. Aortic valve regurgitation is trivial. Pulmonic Valve: The pulmonic valve was normal in structure. Pulmonic valve regurgitation is  not visualized. Aorta: The aortic root and ascending aorta are structurally normal, with no evidence of dilitation. IAS/Shunts: No atrial level shunt detected by color flow Doppler.  LEFT VENTRICLE PLAX 2D LVIDd:         4.26 cm  Diastology LVIDs:         2.64 cm  LV e' lateral:   8.05 cm/s LV PW:         0.68 cm  LV E/e' lateral: 8.7 LV IVS:        0.71 cm  LV e' medial:    8.05 cm/s LVOT diam:     2.00 cm  LV E/e' medial:  8.7 LVOT Area:     3.14 cm  RIGHT VENTRICLE             IVC RV S prime:     19.70 cm/s  IVC diam: 2.32 cm TAPSE (M-mode): 2.8 cm LEFT ATRIUM             Index       RIGHT ATRIUM           Index LA diam:        2.80 cm 1.50 cm/m  RA Area:     16.00 cm LA Vol (A2C):   35.9 ml 19.17 ml/m RA Volume:   41.20 ml  22.00 ml/m LA Vol (A4C):   35.0 ml 18.69 ml/m LA Biplane Vol: 35.1 ml 18.75 ml/m   AORTA Ao Root diam: 3.00 cm MITRAL VALVE               TRICUSPID VALVE MV Area (PHT): 2.39 cm    TR Peak grad:   39.9 mmHg MV Decel Time: 318 msec    TR Vmax:        316.00 cm/s MV E velocity: 70.00 cm/s MV A velocity: 93.00 cm/s  SHUNTS MV E/A ratio:  0.75        Systemic Diam: 2.00 cm Serafina Royals MD Electronically signed by Serafina Royals MD Signature Date/Time: 09/22/2019/7:30:23 PM    Final    CT RENAL STONE STUDY  Result Date: 09/12/2019 CLINICAL DATA:  LEFT flank and LEFT lower quadrant pain for 10-12 days, no history of kidney stones or surgery EXAM: CT ABDOMEN AND PELVIS WITHOUT CONTRAST TECHNIQUE: Multidetector CT imaging of the abdomen and pelvis was performed following the standard protocol without IV contrast. Sagittal and coronal MPR images reconstructed from axial data set. No oral contrast. COMPARISON:  None FINDINGS: Lower chest: Small LEFT pleural effusion with partial atelectasis of LEFT lower lobe. Hepatobiliary: Gallbladder and liver normal appearance Pancreas: Normal appearance Spleen: Normal appearance Adrenals/Urinary Tract: Adrenal glands, kidneys, ureters, and bladder  normal appearance per Stomach/Bowel: Normal appendix. Minimally prominent stool in rectum. Large and small bowel loops otherwise unremarkable. No definite gastric abnormalities. Vascular/Lymphatic: Atherosclerotic calcifications aorta  and iliac arteries without aneurysm. No adenopathy. Reproductive: Prostatic enlargement, gland measuring 4.7 x 4.3 cm image 72. Other: No free air or free fluid. No inflammatory process. Small RIGHT inguinal hernia containing fat. Musculoskeletal: Small lucent focus within posterior aspect of L4 vertebral body RIGHT of midline, nonspecific. No additional osseous abnormalities. IMPRESSION: Small LEFT pleural effusion with partial atelectasis of LEFT lower lobe. Small RIGHT inguinal hernia containing fat. Prostatic enlargement. No acute intra-abdominal or intrapelvic abnormalities. Small nonspecific lucent focus within the posterior aspect of the L4 vertebral body, nonspecific, cannot exclude myeloma or potentially lytic metastasis; correlation with workup for myeloma recommended. Aortic Atherosclerosis (ICD10-I70.0). Electronically Signed   By: Lavonia Dana M.D.   On: 09/12/2019 11:00   CT IMAGE GUIDED FLUID DRAIN BY CATHETER  Result Date: 09/23/2019 INDICATION: Complex left-sided pleural effusion. Request made for image guided placement of left-sided chest tube prior to potential definitive VATS. EXAM: CT IMAGE GUIDED FLUID DRAIN BY CATHETER COMPARISON:  Chest CT-earlier same day; 09/19/2019: Chest ultrasound-09/23/2019 MEDICATIONS: The patient is currently admitted to the hospital and receiving intravenous antibiotics. The antibiotics were administered within an appropriate time frame prior to the initiation of the procedure. ANESTHESIA/SEDATION: Moderate (conscious) sedation was employed during this procedure. A total of Versed 1 mg and Fentanyl 50 mcg was administered intravenously. Moderate Sedation Time: 25 minutes. The patient's level of consciousness and vital signs were  monitored continuously by radiology nursing throughout the procedure under my direct supervision. CONTRAST:  None COMPLICATIONS: None immediate. PROCEDURE: Informed written consent was obtained from the patient after a discussion of the risks, benefits and alternatives to treatment. The patient was placed supine on the CT gantry and a pre procedural CT was performed re-demonstrating the known large partially loculated left-sided pleural effusion. The procedure was planned. A timeout was performed prior to the initiation of the procedure. The skin overlying the inferolateral aspect the left lower chest was prepped and draped in the usual sterile fashion. The overlying soft tissues were anesthetized with 1% lidocaine with epinephrine. Appropriate trajectory was planned with the use of a 22 gauge spinal needle. An 18 gauge trocar needle was advanced into the abscess/fluid collection and a short Amplatz super stiff wire was coiled within the collection. Appropriate positioning was confirmed with a limited CT scan. The tract was serially dilated allowing placement of a 16 French all-purpose drainage catheter. Appropriate positioning was confirmed with a limited postprocedural CT scan. Approximately 650 cc ml of dark yellow fluid was aspirated. The tube was connected to a pleura vac device and sutured in place. A dressing was placed. The patient tolerated the procedure well without immediate post procedural complication. IMPRESSION: Successful CT guided placement of a 25 French all purpose drain catheter into the left pleural space with aspiration of 650 cc of dark yellow pleural fluid. Samples were sent to the laboratory as requested by the ordering clinical team. Electronically Signed   By: Sandi Mariscal M.D.   On: 09/23/2019 17:09      ASSESSMENT/PLAN   Left lung mass with associated hilar lymphadenopathy   - possible infectious vs malignant in etiology    - agree with empiric tx for CAP-zosyn   - s/p chest  tube insertion - fluid cultures negative - serosang active draining without air leak    - will need interval imaging /PET to re-evaluate in 8 wks post recovery    - discussed and plan for bronchoscopic evaluation     -agree with workup including stre pneumoniae and legionella    -  fungitell is in process    -procalcitonin is elevated at 4.92 -nasal MRSA in process   -leukocytosis has resolved       Left lung empyema and Atelectasis  -will add recruitment maneuvers via MetaNEB q8h with albuterol  and mucomyst 65ml bid 20% to regimen  -s/p Chest tube with active serosang low volume   -plan for repeat imaging and removal of Chest tube  -MEtaneb q4h   Thank you for allowing me to participate in the care of this patient.    Patient/Family are satisfied with care plan and all questions have been answered.  This document was prepared using Dragon voice recognition software and may include unintentional dictation errors.     Ottie Glazier, M.D.  Division of [facility] [facility]

## 2019-09-26 NOTE — Progress Notes (Signed)
PT Screen Note  Patient Details Name: Alan Wolf MRN: [id number] DOB: Sep 02, 1950   Cancelled Treatment:    Reason Eval/Treat Not Completed: PT screened, no needs identified, will sign off Pt did well with PT screen, able to rise to EOB and then standing w/o hesitation, able to ambulate ~300 ft w/o AD or O2 (chest tube on water seal, MD had recently taken him off suction); on room air t/o the effort, sats initially dropped to low 90s but back to the mid 90s by then end of the 300 ft..  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  No PT needs moving forward, will complete PT orders.  Kreg Shropshire, DPT 09/26/2019, 1:30 PM

## 2019-09-26 NOTE — Progress Notes (Signed)
Pt does not want to be woke up for the Metaneb throughout the night.

## 2019-09-27 ENCOUNTER — Inpatient Hospital Stay: Payer: Medicare HMO

## 2019-09-27 DIAGNOSIS — J9601 Acute respiratory failure with hypoxia: Secondary | ICD-10-CM

## 2019-09-27 LAB — URINALYSIS, ROUTINE W REFLEX MICROSCOPIC
Bilirubin Urine: NEGATIVE
Glucose, UA: NEGATIVE mg/dL
Hgb urine dipstick: NEGATIVE
Ketones, ur: NEGATIVE mg/dL
Leukocytes,Ua: NEGATIVE
Nitrite: NEGATIVE
Protein, ur: NEGATIVE mg/dL
Specific Gravity, Urine: 1.012 (ref 1.005–1.030)
pH: 7 (ref 5.0–8.0)

## 2019-09-27 LAB — BASIC METABOLIC PANEL
Anion gap: 7 (ref 5–15)
BUN: 15 mg/dL (ref 8–23)
CO2: 26 mmol/L (ref 22–32)
Calcium: 8.3 mg/dL — ABNORMAL LOW (ref 8.9–10.3)
Chloride: 104 mmol/L (ref 98–111)
Creatinine, Ser: 1.08 mg/dL (ref 0.61–1.24)
GFR calc Af Amer: >60 mL/min (ref >60)
GFR calc non Af Amer: >60 mL/min (ref >60)
Glucose, Bld: 163 mg/dL — ABNORMAL HIGH (ref 70–99)
Potassium: 4.1 mmol/L (ref 3.5–5.1)
Sodium: 137 mmol/L (ref 135–145)

## 2019-09-27 LAB — GLUCOSE, CAPILLARY
Glucose-Capillary: 178 mg/dL — ABNORMAL HIGH (ref 70–99)
Glucose-Capillary: 118 mg/dL — ABNORMAL HIGH (ref 70–99)

## 2019-09-27 LAB — CBC
HCT: 31.8 % — ABNORMAL LOW (ref 39.0–52.0)
Hemoglobin: 11.1 g/dL — ABNORMAL LOW (ref 13.0–17.0)
MCH: 30.3 pg (ref 26.0–34.0)
MCHC: 34.9 g/dL (ref 30.0–36.0)
MCV: 86.9 fL (ref 80.0–100.0)
Platelets: 393 10*3/uL (ref 150–400)
RBC: 3.66 MIL/uL — ABNORMAL LOW (ref 4.22–5.81)
RDW: 13.2 % (ref 11.5–15.5)
WBC: 12.1 10*3/uL — ABNORMAL HIGH (ref 4.0–10.5)
nRBC: 0 % (ref 0.0–0.2)

## 2019-09-27 LAB — HEMOGLOBIN A1C
Hgb A1c MFr Bld: 7.4 % — ABNORMAL HIGH (ref 4.8–5.6)
Mean Plasma Glucose: 165.68 mg/dL

## 2019-09-27 MED ORDER — AMOXICILLIN-POT CLAVULANATE 875-125 MG PO TABS
1.0000 | ORAL_TABLET | Freq: Two times a day (BID) | ORAL | 0 refills | Status: AC
Start: 2019-09-27 — End: 2019-10-02

## 2019-09-27 MED ORDER — OXYCODONE-ACETAMINOPHEN 5-325 MG PO TABS: 1.0000 | ORAL_TABLET | Freq: Three times a day (TID) | ORAL | 0 refills | Status: AC | PRN

## 2019-09-27 MED ORDER — BENZONATATE 200 MG PO CAPS: 200.0000 mg | ORAL_CAPSULE | Freq: Three times a day (TID) | ORAL | 0 refills | Status: DC

## 2019-09-27 MED ORDER — INSULIN ASPART 100 UNIT/ML ~~LOC~~ SOLN
0.0000 [IU] | Freq: Three times a day (TID) | SUBCUTANEOUS | Status: DC
  Administered 2019-09-27: 2 [IU] via SUBCUTANEOUS
  Filled 2019-09-27: qty 1

## 2019-09-27 NOTE — Progress Notes (Signed)
Occupational Therapy Treatment Patient Details Name: Alan Wolf MRN: [id number] DOB: 1950-05-07 Today's Date: 09/27/2019    History of present illness 69 y.o. male without significant past medical history, who presents with left flank pain. Patient states that he has been having left flank pain for almost 1 month.  The pain is located in the left flank area, constant, sharp, moderate to severe, nonradiating.  It is aggravated by movement or deep breath.  Patient has mild cough and mild shortness of breath.  Occasionally he coughs up streaks of pink-colored mucus.  Denies fever or chills. He was initially seen and thought to have pneumonia on chest x-ray. He got 2 different antibiotics but is not any better.  Patient does not have nausea, vomiting, diarrhea, abdominal pain, symptoms of UTI or unilateral weakness. Pt was seen in ED 09/12/19, and had CT-renal stone protocol which showed small LEFT pleural effusion and a small nonspecific lucent focus within the posterior aspect of the L4 vertebral body.  Further work-up with CTA chest showed lung mass.  Patient noted also to have intermittent fevers.   OT comments  Pt seen for OT tx this date to f/u re: energy conservation strategies for home with pt and his daughter who is present throughout session. OT provides below-listed education (see "other exercises" section) and provides handout. Both parties with good reception of education. OT educates re: benefits of monitoring oxygen with activity at home. Both parties receptive and daughter is engaged and asks f/u questions. OT engages pt in standing grooming tasks and educates re: having all needed ADL items at one level and within reach before starting morning routine to reduce energy expenditure. Pt's O2 noted to drop very slightly with standing activity on RA (to 89-91%), but within ~10 seconds of seated rest, saturation becomes >91% and maintains with RA. Pt tolerates session well. Will continue to  follow while pt in acute setting to educate about EC with ADLs/IADLs, but continue to anticipate that pt will not require therapy service follow up.    Follow Up Recommendations  No OT follow up;Supervision - Intermittent    Equipment Recommendations  3 in 1 bedside commode;Tub/shower seat    Recommendations for Other Services      Precautions / Restrictions Precautions Precautions: Fall Precaution Comments: Chest tube Restrictions Weight Bearing Restrictions: No       Mobility Bed Mobility Overal bed mobility: Independent                Transfers Overall transfer level: Needs assistance Equipment used: None Transfers: Sit to/from Stand;Stand Pivot Transfers Sit to Stand: Supervision Stand pivot transfers: Supervision       General transfer comment: supervision for safety, monitored O2 t/o, does drop to 89-91% with standing activity, recovers in sitting to >91% on RA    Balance Overall balance assessment: Modified Independent Sitting-balance support: Feet supported Sitting balance-Leahy Scale: Normal     Standing balance support: During functional activity Standing balance-Leahy Scale: Good                             ADL either performed or assessed with clinical judgement   ADL Overall ADL's : Needs assistance/impaired     Grooming: Supervision/safety;Standing Grooming Details (indicate cue type and reason): with one verbal cue for energy conservation to have all necessary items setup prior to completing grooming tasks in standing to minimize energy expended to retrieve items. Pt and dtr with good understanding  Lower Body Dressing: Supervision/safety;Sitting/lateral leans Lower Body Dressing Details (indicate cue type and reason): to thread socks                     Vision Baseline Vision/History: No visual deficits Patient Visual Report: No change from baseline Vision Assessment?: No apparent visual deficits    Perception     Praxis      Cognition Arousal/Alertness: Awake/alert Behavior During Therapy: WFL for tasks assessed/performed Overall Cognitive Status: Within Functional Limits for tasks assessed                                          Exercises Other Exercises Other Exercises: OT faciltiates ed re: EC strategies including modifying bathing/dressing, use of utility cart for IADLs such as laundry/cooking, shower chair and leaving bathroom door open to allow steam to dissipate. Pt and daughter with good reception of education. Handout issued.   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 He reports, "she moves around a lot so I have to keep up with her". 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      Prior Functioning/Environment              Frequency  Min 1X/week        Progress Toward Goals  OT Goals(current goals can now be found in the care plan section)  Progress towards OT goals: Progressing toward goals  Acute Rehab OT Goals Patient Stated Goal: to go home so I can help my wife OT Goal Formulation: With patient Time For Goal Achievement: 10/10/19 Potential to Achieve Goals: Good  Plan      Co-evaluation                 AM-PAC OT "6 Clicks" Daily Activity     Outcome Measure   Help from another person eating meals?: None Help from another person taking care of personal grooming?: None Help from another person toileting, which includes using toliet, bedpan, or urinal?: A Little Help from another person bathing (including washing, rinsing, drying)?: A Little Help from another person to put on and taking off regular upper body clothing?: None Help from another person to put on and taking  off regular lower body clothing?: A Little 6 Click Score: 21    End of Session    OT Visit Diagnosis: Muscle weakness (generalized) (M62.81)   Activity Tolerance Patient tolerated treatment well   Patient Left with family/visitor present;in bed;with call bell/phone within reach   Nurse Communication          Time: 9509-3267 OT Time Calculation (min): 38 min  Charges: OT General Charges $OT Visit: 1 Visit OT Treatments $Self Care/Home Management : 23-37 mins $Therapeutic Activity: 8-22 mins   Gerrianne Scale, [place], OTR/L ascom [phone] 09/27/19, 2:07 PM

## 2019-09-27 NOTE — Progress Notes (Signed)
Patient ID: Alan Wolf, male   DOB: December 15, 1950, 69 y.o.   MRN: [id number]  No new complaints.  CT scan reviewed.  Much improved.  Minimal fluid.  No air leak.  No drainage in last 24 hours  Chest tube removed.    I gave the patient my business card and discharge instructions.  Needs follow-up, probably with Pulmonary but I am happy to see as well.

## 2019-09-27 NOTE — Discharge Summary (Signed)
Discharge Summary  Alan Wolf GQQ:[id number] DOB: 02/28/1950  PCP: Marguerita Merles, MD  Admit date: 09/19/2019 Discharge date: 09/27/2019  Time spent: 40 mins  Recommendations for Outpatient Follow-up:  1. PCP in 1 week 2. Pulmonary in 1 week  Discharge Diagnoses:  Active Hospital Problems   Diagnosis Date Noted  . Mass of lower lobe of left lung 09/19/2019  . Leukocytosis 09/19/2019  . Left flank pain 09/19/2019    Resolved Hospital Problems  No resolved problems to display.    Discharge Condition: Stable  Diet recommendation: Moderate carb  Vitals:   09/27/19 0549 09/27/19 1350  BP: (!) 154/70 (!) 145/78  Pulse: 78 84  Resp: 20 18  Temp: (!) 97.3 F (36.3 C) 98.7 F (37.1 C)  SpO2: 100% 96%    History of present illness:  Alan Wolf a 69 y.o.malewithout significant past medical history, who presents with left flank pain. Patient states that he has been having left flank pain for almost 1 month.The pain is located in the left flank area, constant, sharp, moderate to severe, nonradiating. It is aggravated by movement or deep breath. Patient has mild cough and mild shortness of breath. Occasionally he coughs up streaks of pink-colored mucus. Denies fever or chills.He was initially seen and thought to have pneumonia on chest x-ray. He got 2 different antibiotics but is not any better.Patient does not have nausea,vomiting, diarrhea, abdominal pain, symptoms of UTI or unilateral weakness. Pt was seen in ED 09/12/19, and had CT-renal stone protocol which showedsmall LEFT pleural effusionand a small nonspecific lucent focus within the posterior aspect of the L4 vertebral body.  Further work-up with CTA chest showed lung mass.  Patient noted also to have intermittent fevers.  Patient admitted for further management.    Today, patient reported feeling better, noted cough has improved with whitish sputum.  Patient denied any worsening shortness of breath,  chest pain, abdominal pain, nausea/vomiting, fever/chills.  Discussed discharge plan with patient and daughter, advised to follow-up with PCP and pulmonology in 1 week.  Discussed about recent findings of A1c in the diabetic range, patient declines starting any medication, and would like to continue diet modification and exercise.  Advised patient to follow-up with PCP for further management.    Hospital Course:  Principal Problem:   Mass of lower lobe of left lung Active Problems:   Leukocytosis   Left flank pain  Left lower lobe mass with hilar lymphadenopathy Left lung empyema Possible sepsis likely 2/2 community-acquired pneumonia Currently afebrile, with resolving leukocytosis Elevated procalcitonin CT chest showed 5.7 cm left lower lobe mass with bronchial adenopathy, unclear etiology, unable to exclude malignancy On 09/24/2019, patient had CT-guided chest tube placed after failed trial of thoracentesis, also had intrapleural TPA to avoid having a VATS procedure Urine strep pneumo, Legionella both negative Peritoneal fluid culture showed no growth, BC x2 no growth to date Fungitell pending, will follow up with pulmonary CT surgery removed chest tube on 09/27/2019, no further recommendation, advised to follow-up with pulmonology Discharged on cough suppressant, p.o. Augmentin for another 5 days Advised to follow-up with PCP and pulmonology in 1 week  Acute hypoxic respiratory failure Resolved Likely 2/2 above  New onset diabetes mellitus, uncontrolled with hyperglycemia Patient reported history of being prediabetic A1c done on 09/27/2019, 7.4 Patient refusing to start any oral medications for diabetes and wants diet modification and exercise Advised patient to follow-up with PCP for further management.       Malnutrition Type:  Malnutrition Characteristics:      Nutrition Interventions:      Estimated body mass index is 21.09 kg/m as calculated from the  following:   Height as of this encounter: 5\' 11"  (1.803 m).   Weight as of this encounter: 68.6 kg.    Procedures:  Attempted thoracentesis  Chest tube placement  Consultations:  Pulmonology  Cardiothoracic surgeon  Discharge Exam: BP (!) 145/78 (BP Location: Left Arm)   Pulse 84   Temp 98.7 F (37.1 C) (Oral)   Resp 18   Ht 5\' 11"  (1.803 m)   Wt 68.6 kg   SpO2 96%   BMI 21.09 kg/m   General: NAD Cardiovascular: S1, S2 present Respiratory: CTA B    Discharge Instructions You were cared for by a hospitalist during your hospital stay. If you have any questions about your discharge medications or the care you received while you were in the hospital after you are discharged, you can call the unit and asked to speak with the hospitalist on call if the hospitalist that took care of you is not available. Once you are discharged, your primary care physician will handle any further medical issues. Please note that NO REFILLS for any discharge medications will be authorized once you are discharged, as it is imperative that you return to your primary care physician (or establish a relationship with a primary care physician if you do not have one) for your aftercare needs so that they can reassess your need for medications and monitor your lab values.  Discharge Instructions    Diet - low sodium heart healthy   Complete by: As directed    Increase activity slowly   Complete by: As directed    No wound care   Complete by: As directed      Allergies as of 09/27/2019      Reactions   Penicillins    Per pt - when a child, it made the fever go up instead of down      Medication List    STOP taking these medications   naproxen 500 MG tablet Commonly known as: NAPROSYN     TAKE these medications   amoxicillin-clavulanate 875-125 MG tablet Commonly known as: Augmentin Take 1 tablet by mouth 2 (two) times daily for 5 days.   benzonatate 200 MG capsule Commonly known as:  TESSALON Take 1 capsule (200 mg total) by mouth 3 (three) times daily.   guaiFENesin-codeine 100-10 MG/5ML syrup Take 5 mLs by mouth as directed.   oxyCODONE-acetaminophen 5-325 MG tablet Commonly known as: PERCOCET/ROXICET Take 1 tablet by mouth every 8 (eight) hours as needed for up to 5 days for moderate pain or severe pain.      Allergies  Allergen Reactions  . Penicillins     Per pt - when a child, it made the fever go up instead of down    Follow-up Information    Marguerita Merles, MD. Schedule an appointment as soon as possible for a visit in 1 week(s).   Specialty: Family Medicine Contact information: [place] [address] [phone]        Ottie Glazier, MD. Schedule an appointment as soon as possible for a visit in 1 week(s).   Specialty: Pulmonary Disease Contact information: [place] [address] [phone]                The results of significant diagnostics from this hospitalization (including imaging, microbiology, ancillary and laboratory)  are listed below for reference.    Significant Diagnostic Studies: DG Chest 1 View  Result Date: 09/21/2019 CLINICAL DATA:  69 year old with progressive shortness of breath with exertion over night and acute respiratory failure. Fever and cough. EXAM: Portable CHEST 1 VIEW COMPARISON:  CTA chest 09/19/2019. FINDINGS: Since the CT 2 days ago, marked increase in size of the now very large LEFT pleural effusion. Worsening dense consolidation in the LEFT LOWER LOBE and lingula. New small RIGHT pleural effusion and associated mild passive atelectasis in the RIGHT LOWER LOBE. RIGHT lung remains clear otherwise. Heart moderately enlarged. Pulmonary vascularity normal. IMPRESSION: 1. Marked increase in size of the now large LEFT pleural effusion since the CT 2 days ago. 2. Worsening dense atelectasis and/or pneumonia in the LEFT LOWER LOBE and lingula. 3. New small RIGHT pleural  effusion and associated mild passive atelectasis in the RIGHT LOWER LOBE. Electronically Signed   By: Evangeline Dakin M.D.   On: 09/21/2019 12:49   DG Chest 2 View  Result Date: 09/26/2019 CLINICAL DATA:  Chest tube EXAM: CHEST - 2 VIEW COMPARISON:  09/25/2019 FINDINGS: Left chest tube remains present with pigtail in similar position. Similar appearance of left mid and lower lung opacity. Similar residual small pleural effusions and bibasilar atelectasis. No pneumothorax. Stable cardiomediastinal contours. No acute osseous abnormality. IMPRESSION: No substantial change since 09/25/2019. Electronically Signed   By: Macy Mis M.D.   On: 09/26/2019 08:06   DG Chest 2 View  Result Date: 09/25/2019 CLINICAL DATA:  Chest pain around chest tube site EXAM: CHEST - 2 VIEW COMPARISON:  09/24/2019 FINDINGS: Pigtail LEFT thoracostomy tube again identified. Normal heart size, mediastinal contours, and pulmonary vascularity. Atherosclerotic calcification aorta. Bibasilar small pleural effusions and atelectasis slightly greater on RIGHT, with significantly decreased LEFT pleural effusion since previous exam. Decreased infiltrate LEFT mid lung. No pneumothorax or acute osseous findings. IMPRESSION: Decreased LEFT pleural effusion and basilar atelectasis since prior study. Small residual bibasilar effusions and atelectasis. Improved LEFT midlung infiltrate. Electronically Signed   By: Lavonia Dana M.D.   On: 09/25/2019 08:24   CT CHEST WO CONTRAST  Result Date: 09/27/2019 CLINICAL DATA:  Empyema, follow-up lung infection EXAM: CT CHEST WITHOUT CONTRAST TECHNIQUE: Multidetector CT imaging of the chest was performed following the standard protocol without IV contrast. COMPARISON:  CT study September 23, 2019 and September 19, 2019 FINDINGS: Cardiovascular: Calcified atheromatous plaque in the thoracic aorta, mild. No aneurysmal dilation. Limited assessment of heart and great vessels without contrast. Small pericardial fluid.  Stranding in the pre pericardial fat. Mediastinum/Nodes: No adenopathy in the chest. Scattered small lymph nodes are likely reactive. Hilar structures with limited assessment due to lack of intravenous contrast. Thoracic inlet structures are normal. No axillary lymphadenopathy. Esophagus grossly normal. Lungs/Pleura: Interval placement of a LEFT-sided chest tube with evacuation of the largest area of pleural fluid in the posterolateral LEFT chest. Improved aeration at the LEFT lung base and in the lingula compared to the previous study. Persistent consolidative change at the LEFT lung base. Trace amount of pleural fluid in the medial LEFT lung base (image 95, series 2) 3.7 x 1.5 cm. Small lenticular collection along the anterior LEFT chest (image 69, series 3) unchanged at 4.3 x 2.4 cm. Patchy ground-glass in the RIGHT upper lobe with some septal thickening is unchanged. Trace RIGHT effusion with juxta diaphragmatic airspace disease just above the RIGHT hemidiaphragm also stable. Upper Abdomen: Incidental imaging of upper abdominal contents without acute process. Musculoskeletal: No acute musculoskeletal findings.  IMPRESSION: 1. Interval placement of a LEFT-sided chest tube with evacuation of the largest area of pleural fluid in the posterolateral LEFT chest. 2. Improved aeration at the LEFT lung base still with some areas of consolidation. Trace pleural fluid also noted in the medial LEFT lung base (image 95, series 2) 3.7 x 1.5 cm. 3. Small lenticular collection along the anterior LEFT chest unchanged at 4.3 x 2.4 cm. 4. Patchy ground-glass in the RIGHT upper lobe and RIGHT lower lobe airspace disease likely reflecting multifocal pneumonia. Suggest follow-up to ensure resolution particularly of the ground-glass process in the RIGHT upper lobe. 5. Small lymph nodes in the chest better seen on the prior study are likely reactive, attention on follow-up. 6. Aortic atherosclerosis. Aortic Atherosclerosis  (ICD10-I70.0). Electronically Signed   By: Zetta Bills M.D.   On: 09/27/2019 10:38   CT CHEST W CONTRAST  Result Date: 09/23/2019 CLINICAL DATA:  69 year old male with abnormal x-ray. Pleural effusion. EXAM: CT CHEST WITH CONTRAST TECHNIQUE: Multidetector CT imaging of the chest was performed during intravenous contrast administration. CONTRAST:  26mL OMNIPAQUE IOHEXOL 300 MG/ML  SOLN COMPARISON:  Chest CT dated 09/19/2019 and radiograph dated 09/22/2019. FINDINGS: Cardiovascular: There is no cardiomegaly or pericardial effusion. Coronary vascular calcification primarily involving the LAD. The thoracic aorta is unremarkable. The origins of the great vessels of the aortic arch are patent as visualized. Evaluation of the pulmonary arteries is limited due to suboptimal visualization of the distal branches. No large or central pulmonary artery embolus identified. Mediastinum/Nodes: Mildly enlarged right hilar lymph node measures 12 mm in short axis. Subcarinal lymph node measures 11 mm. The esophagus is grossly unremarkable. No mediastinal fluid collection. Lungs/Pleura: There is a large left pleural effusion with probable areas of loculation. There is compressive atelectasis of the majority of the left lung with a small aerated portion of the left upper lobe. Small pockets of air within the effusion inferiorly may be related to recent procedure or represent an infectious process. A bronchopleural fistula is not excluded. There is a small right pleural effusion. Partial consolidative changes involving the right lung base and right middle lobe may represent atelectasis or infiltrate. Smaller ground-glass clusters in the right upper lobe may represent pneumonia. There is no pneumothorax. The central airways are patent. Upper Abdomen: No acute abnormality. Musculoskeletal: No chest wall abnormality. No acute or significant osseous findings. IMPRESSION: 1. No central pulmonary artery embolus identified. 2. Large left  pleural effusion with consolidation of the majority of the left lung. Small pockets of air within the effusion inferiorly may be related to recent procedure or represent an infectious process. A bronchopleural fistula is not excluded. 3. Small right pleural effusion. 4. Partial consolidative changes involving the right lung base and right middle lobe may represent atelectasis or infiltrate. Smaller ground-glass clusters in the right upper lobe may represent pneumonia. Clinical correlation and follow-up recommended. 5. Mildly enlarged right hilar and subcarinal lymph nodes, likely reactive. 6. Aortic Atherosclerosis (ICD10-I70.0). Electronically Signed   By: Anner Crete M.D.   On: 09/23/2019 15:46   CT Angio Chest PE W and/or Wo Contrast  Result Date: 09/19/2019 CLINICAL DATA:  Chest pain with pleurisy or effusion suspected EXAM: CT ANGIOGRAPHY CHEST WITH CONTRAST TECHNIQUE: Multidetector CT imaging of the chest was performed using the standard protocol during bolus administration of intravenous contrast. Multiplanar CT image reconstructions and MIPs were obtained to evaluate the vascular anatomy. CONTRAST:  48mL OMNIPAQUE IOHEXOL 350 MG/ML SOLN COMPARISON:  Abdominal CT 09/12/2019 FINDINGS: Cardiovascular: Normal  heart size. No pericardial effusion. Atheromatous calcification along the aorta and left main coronary. Mediastinum/Nodes: Mildly enlarged bronchial lymph nodes the left lower lobe, 11 mm in diameter. Lungs/Pleura: Ovoid area of decreased enhancement and vessel attenuation measuring up to 5.7 cm on axial slices. There is adjacent enhancing compressed appearing lung. Small left pleural effusion with some scalloping. Upper Abdomen: Negative Musculoskeletal: Negative for bone erosion. No evidence of osseous metastatic disease. Review of the MIP images confirms the above findings. IMPRESSION: 1. Concern for left lower lobe mass with bronchial adenopathy. Recommend referral to multi disciplinary  thoracic oncology. Findings are not definitive for malignancy and could be related to round atelectasis or atypical pneumonia, consider antibiotic therapy in the meantime. 2. Small left pleural effusion complicated by areas of loculation. 3. Negative for pulmonary embolism. Electronically Signed   By: Monte Fantasia M.D.   On: 09/19/2019 06:44   [place] CHEST (PLEURAL EFFUSION)  Result Date: 09/23/2019 CLINICAL DATA:  Concern for worsening left-sided pleural effusion. EXAM: CHEST ULTRASOUND COMPARISON:  Chest CT-09/19/2019; chest radiograph-09/22/2019 FINDINGS: Sonographic evaluation of the left hemithorax demonstrates apparent dense consolidation of the left lung (favored) versus densely loculated pleural fluid, not amenable to ultrasound-guided thoracentesis. No thoracentesis attempted. IMPRESSION: Dense consolidation of the left lung (favored) versus densely loculated pleural fluid, not amenable to ultrasound-guided thoracentesis. No thoracentesis attempted. Recommend proceeding with repeat contrast-enhanced chest CT for further evaluation. Critical Value/emergent results were relayed to referring physician via epic messenger at the time of interpretation on 09/23/2019 at 10:37 am to provider [facility] , who acknowledged these results. Electronically Signed   By: Sandi Mariscal M.D.   On: 09/23/2019 10:38   US Venous Img Lower Bilateral (DVT)  Result Date: 09/19/2019 CLINICAL DATA:  Elevated D-dimer.  Evaluate for DVT. EXAM: BILATERAL LOWER EXTREMITY VENOUS DOPPLER ULTRASOUND TECHNIQUE: Gray-scale sonography with graded compression, as well as color Doppler and duplex ultrasound were performed to evaluate the lower extremity deep venous systems from the level of the common femoral vein and including the common femoral, femoral, profunda femoral, popliteal and calf veins including the posterior tibial, peroneal and gastrocnemius veins when visible. The superficial great saphenous vein was also interrogated.  Spectral Doppler was utilized to evaluate flow at rest and with distal augmentation maneuvers in the common femoral, femoral and popliteal veins. COMPARISON:  None. FINDINGS: RIGHT LOWER EXTREMITY Common Femoral Vein: No evidence of thrombus. Normal compressibility, respiratory phasicity and response to augmentation. Saphenofemoral Junction: No evidence of thrombus. Normal compressibility and flow on color Doppler imaging. Profunda Femoral Vein: No evidence of thrombus. Normal compressibility and flow on color Doppler imaging. Femoral Vein: No evidence of thrombus. Normal compressibility, respiratory phasicity and response to augmentation. Popliteal Vein: No evidence of thrombus. Normal compressibility, respiratory phasicity and response to augmentation. Calf Veins: No evidence of thrombus. Normal compressibility and flow on color Doppler imaging. Superficial Great Saphenous Vein: No evidence of thrombus. Normal compressibility. Venous Reflux:  None. Other Findings:  None. LEFT LOWER EXTREMITY Common Femoral Vein: No evidence of thrombus. Normal compressibility, respiratory phasicity and response to augmentation. Saphenofemoral Junction: No evidence of thrombus. Normal compressibility and flow on color Doppler imaging. Profunda Femoral Vein: No evidence of thrombus. Normal compressibility and flow on color Doppler imaging. Femoral Vein: No evidence of thrombus. Normal compressibility, respiratory phasicity and response to augmentation. Popliteal Vein: No evidence of thrombus. Normal compressibility, respiratory phasicity and response to augmentation. Calf Veins: No evidence of thrombus. Normal compressibility and flow on color Doppler imaging. Superficial Great Saphenous  Vein: No evidence of thrombus. Normal compressibility. Venous Reflux:  None. Other Findings:  None. IMPRESSION: No evidence of DVT within either lower extremity. Electronically Signed   By: Sandi Mariscal M.D.   On: 09/19/2019 12:29   DG Chest Port 1  View  Result Date: 09/24/2019 CLINICAL DATA:  Chest tube placement EXAM: PORTABLE CHEST 1 VIEW COMPARISON:  09/22/2019 FINDINGS: A left chest tube has been place with evacuation of much of the left pleural effusion. A moderate-sized effusion remains. Residual consolidation or atelectasis in the left lung. Linear atelectasis in the right lung base. Heart size is partly obscured but appears normal. IMPRESSION: Interval placement of left chest tube with evacuation of much of the left pleural effusion. Residual consolidation or atelectasis in the left lung. Electronically Signed   By: Lucienne Capers M.D.   On: 09/24/2019 03:24   DG Chest Port 1 View  Result Date: 09/22/2019 CLINICAL DATA:  Pleural effusion. EXAM: PORTABLE CHEST 1 VIEW COMPARISON:  Chest x-ray dated 09/21/2019. FINDINGS: Near complete opacification of the LEFT hemithorax, stable to slightly increased compared to yesterday's chest x-ray. Probable small RIGHT pleural effusion and/or RIGHT basilar atelectasis. RIGHT lung remains otherwise clear. No pneumothorax is seen. Cardiomediastinal size and position is grossly stable. IMPRESSION: 1. Near complete opacification of the LEFT hemithorax, stable to slightly increased compared to yesterday's chest x-ray, significantly increased compared to the chest CT angiogram of 09/19/2019. This is presumably a combination of large pleural effusion and LEFT lower lung pneumonia versus atelectasis. 2. Probable small pleural effusion and/or atelectasis at the RIGHT lung base. Electronically Signed   By: Franki Cabot M.D.   On: 09/22/2019 12:09   ECHOCARDIOGRAM COMPLETE  Result Date: 09/22/2019    ECHOCARDIOGRAM REPORT   Patient Name:   VIVAAN HELSETH Date of Exam: 09/22/2019 Medical Rec #:  [id number]        Height:       71.0 in Accession #:    [id number]       Weight:       151.2 lb Date of Birth:  [birth year]        BSA:          1.872 m Patient Age:    69 years         BP:           120/69 mmHg Patient Gender:  M                HR:           94 bpm. Exam Location:  [facility] Procedure: 2D Echo Indications:     CHF-Acute Diastolic 323.55/D32.20  History:         Patient has no prior history of Echocardiogram examinations.  Sonographer:     Avanell Shackleton Referring Phys:  [id number] Sidney Ace Diagnosing Phys: Serafina Royals MD IMPRESSIONS  1. Left ventricular ejection fraction, by estimation, is 65 to 70%. The left ventricle has normal function. The left ventricle has no regional wall motion abnormalities. Left ventricular diastolic parameters were normal.  2. Right ventricular systolic function is normal. The right ventricular size is normal. There is moderately elevated pulmonary artery systolic pressure.  3. The mitral valve is normal in structure. Trivial mitral valve regurgitation.  4. The aortic valve is normal in structure. Aortic valve regurgitation is trivial. FINDINGS  Left Ventricle: Left ventricular ejection fraction, by estimation, is 65 to 70%. The left ventricle has normal function. The left ventricle has no regional wall motion  abnormalities. The left ventricular internal cavity size was small. There is no left ventricular hypertrophy. Left ventricular diastolic parameters were normal. Right Ventricle: The right ventricular size is normal. No increase in right ventricular wall thickness. Right ventricular systolic function is normal. There is moderately elevated pulmonary artery systolic pressure. The tricuspid regurgitant velocity is 3.16 m/s, and with an assumed right atrial pressure of 10 mmHg, the estimated right ventricular systolic pressure is 21.3 mmHg. Left Atrium: Left atrial size was normal in size. Right Atrium: Right atrial size was normal in size. Pericardium: There is no evidence of pericardial effusion. Mitral Valve: The mitral valve is normal in structure. Trivial mitral valve regurgitation. Tricuspid Valve: The tricuspid valve is normal in structure. Tricuspid valve regurgitation is not  demonstrated. Aortic Valve: The aortic valve is normal in structure. Aortic valve regurgitation is trivial. Pulmonic Valve: The pulmonic valve was normal in structure. Pulmonic valve regurgitation is not visualized. Aorta: The aortic root and ascending aorta are structurally normal, with no evidence of dilitation. IAS/Shunts: No atrial level shunt detected by color flow Doppler.  LEFT VENTRICLE PLAX 2D LVIDd:         4.26 cm  Diastology LVIDs:         2.64 cm  LV e' lateral:   8.05 cm/s LV PW:         0.68 cm  LV E/e' lateral: 8.7 LV IVS:        0.71 cm  LV e' medial:    8.05 cm/s LVOT diam:     2.00 cm  LV E/e' medial:  8.7 LVOT Area:     3.14 cm  RIGHT VENTRICLE             IVC RV S prime:     19.70 cm/s  IVC diam: 2.32 cm TAPSE (M-mode): 2.8 cm LEFT ATRIUM             Index       RIGHT ATRIUM           Index LA diam:        2.80 cm 1.50 cm/m  RA Area:     16.00 cm LA Vol (A2C):   35.9 ml 19.17 ml/m RA Volume:   41.20 ml  22.00 ml/m LA Vol (A4C):   35.0 ml 18.69 ml/m LA Biplane Vol: 35.1 ml 18.75 ml/m   AORTA Ao Root diam: 3.00 cm MITRAL VALVE               TRICUSPID VALVE MV Area (PHT): 2.39 cm    TR Peak grad:   39.9 mmHg MV Decel Time: 318 msec    TR Vmax:        316.00 cm/s MV E velocity: 70.00 cm/s MV A velocity: 93.00 cm/s  SHUNTS MV E/A ratio:  0.75        Systemic Diam: 2.00 cm Serafina Royals MD Electronically signed by Serafina Royals MD Signature Date/Time: 09/22/2019/7:30:23 PM    Final    CT RENAL STONE STUDY  Result Date: 09/12/2019 CLINICAL DATA:  LEFT flank and LEFT lower quadrant pain for 10-12 days, no history of kidney stones or surgery EXAM: CT ABDOMEN AND PELVIS WITHOUT CONTRAST TECHNIQUE: Multidetector CT imaging of the abdomen and pelvis was performed following the standard protocol without IV contrast. Sagittal and coronal MPR images reconstructed from axial data set. No oral contrast. COMPARISON:  None FINDINGS: Lower chest: Small LEFT pleural effusion with partial atelectasis of  LEFT lower lobe. Hepatobiliary: Gallbladder and  liver normal appearance Pancreas: Normal appearance Spleen: Normal appearance Adrenals/Urinary Tract: Adrenal glands, kidneys, ureters, and bladder normal appearance per Stomach/Bowel: Normal appendix. Minimally prominent stool in rectum. Large and small bowel loops otherwise unremarkable. No definite gastric abnormalities. Vascular/Lymphatic: Atherosclerotic calcifications aorta and iliac arteries without aneurysm. No adenopathy. Reproductive: Prostatic enlargement, gland measuring 4.7 x 4.3 cm image 72. Other: No free air or free fluid. No inflammatory process. Small RIGHT inguinal hernia containing fat. Musculoskeletal: Small lucent focus within posterior aspect of L4 vertebral body RIGHT of midline, nonspecific. No additional osseous abnormalities. IMPRESSION: Small LEFT pleural effusion with partial atelectasis of LEFT lower lobe. Small RIGHT inguinal hernia containing fat. Prostatic enlargement. No acute intra-abdominal or intrapelvic abnormalities. Small nonspecific lucent focus within the posterior aspect of the L4 vertebral body, nonspecific, cannot exclude myeloma or potentially lytic metastasis; correlation with workup for myeloma recommended. Aortic Atherosclerosis (ICD10-I70.0). Electronically Signed   By: Lavonia Dana M.D.   On: 09/12/2019 11:00   CT IMAGE GUIDED FLUID DRAIN BY CATHETER  Result Date: 09/23/2019 INDICATION: Complex left-sided pleural effusion. Request made for image guided placement of left-sided chest tube prior to potential definitive VATS. EXAM: CT IMAGE GUIDED FLUID DRAIN BY CATHETER COMPARISON:  Chest CT-earlier same day; 09/19/2019: Chest ultrasound-09/23/2019 MEDICATIONS: The patient is currently admitted to the hospital and receiving intravenous antibiotics. The antibiotics were administered within an appropriate time frame prior to the initiation of the procedure. ANESTHESIA/SEDATION: Moderate (conscious) sedation was employed  during this procedure. A total of Versed 1 mg and Fentanyl 50 mcg was administered intravenously. Moderate Sedation Time: 25 minutes. The patient's level of consciousness and vital signs were monitored continuously by radiology nursing throughout the procedure under my direct supervision. CONTRAST:  None COMPLICATIONS: None immediate. PROCEDURE: Informed written consent was obtained from the patient after a discussion of the risks, benefits and alternatives to treatment. The patient was placed supine on the CT gantry and a pre procedural CT was performed re-demonstrating the known large partially loculated left-sided pleural effusion. The procedure was planned. A timeout was performed prior to the initiation of the procedure. The skin overlying the inferolateral aspect the left lower chest was prepped and draped in the usual sterile fashion. The overlying soft tissues were anesthetized with 1% lidocaine with epinephrine. Appropriate trajectory was planned with the use of a 22 gauge spinal needle. An 18 gauge trocar needle was advanced into the abscess/fluid collection and a short Amplatz super stiff wire was coiled within the collection. Appropriate positioning was confirmed with a limited CT scan. The tract was serially dilated allowing placement of a 16 French all-purpose drainage catheter. Appropriate positioning was confirmed with a limited postprocedural CT scan. Approximately 650 cc ml of dark yellow fluid was aspirated. The tube was connected to a pleura vac device and sutured in place. A dressing was placed. The patient tolerated the procedure well without immediate post procedural complication. IMPRESSION: Successful CT guided placement of a 93 French all purpose drain catheter into the left pleural space with aspiration of 650 cc of dark yellow pleural fluid. Samples were sent to the laboratory as requested by the ordering clinical team. Electronically Signed   By: Sandi Mariscal M.D.   On: 09/23/2019 17:09      Microbiology: Recent Results (from the past 240 hour(s))  SARS Coronavirus 2 by RT PCR (hospital order, performed in [facility] hospital lab) Nasopharyngeal Nasopharyngeal Swab     Status: None   Collection Time: 09/19/19  8:40 AM   Specimen:  Nasopharyngeal Swab  Result Value Ref Range Status   SARS Coronavirus 2 NEGATIVE NEGATIVE Final    Comment: (NOTE) SARS-CoV-2 target nucleic acids are NOT DETECTED.  The SARS-CoV-2 RNA is generally detectable in upper and lower respiratory specimens during the acute phase of infection. The lowest concentration of SARS-CoV-2 viral copies this assay can detect is 250 copies / mL. A negative result does not preclude SARS-CoV-2 infection and should not be used as the sole basis for treatment or other patient management decisions.  A negative result may occur with improper specimen collection / handling, submission of specimen other than nasopharyngeal swab, presence of viral mutation(s) within the areas targeted by this assay, and inadequate number of viral copies (<250 copies / mL). A negative result must be combined with clinical observations, patient history, and epidemiological information.  Fact Sheet for Patients:   StrictlyIdeas.no  Fact Sheet for Healthcare Providers: BankingDealers.co.za  This test is not yet approved or  cleared by the [place] FDA and has been authorized for detection and/or diagnosis of SARS-CoV-2 by FDA under an Emergency Use Authorization (EUA).  This EUA will remain in effect (meaning this test can be used) for the duration of the COVID-19 declaration under Section 564(b)(1) of the Act, 21 U.S.C. section 360bbb-3(b)(1), unless the authorization is terminated or revoked sooner.  Performed at [facility], [address] [place], [place] [address]   CULTURE, BLOOD (ROUTINE X 2) w Reflex to ID Panel     Status: None   Collection Time: 09/19/19   6:34 PM   Specimen: BLOOD  Result Value Ref Range Status   Specimen Description BLOOD LEFT ANTECUBITAL  Final   Special Requests   Final    BOTTLES DRAWN AEROBIC AND ANAEROBIC Blood Culture adequate volume   Culture   Final    NO GROWTH 5 DAYS Performed at [facility], [address] [place], [place] [address]    Report Status 09/24/2019 FINAL  Final  CULTURE, BLOOD (ROUTINE X 2) w Reflex to ID Panel     Status: None   Collection Time: 09/19/19  6:34 PM   Specimen: BLOOD  Result Value Ref Range Status   Specimen Description BLOOD BLOOD LEFT HAND  Final   Special Requests   Final    BOTTLES DRAWN AEROBIC AND ANAEROBIC Blood Culture adequate volume   Culture   Final    NO GROWTH 5 DAYS Performed at [facility], [address], [place], [place] [address]    Report Status 09/24/2019 FINAL  Final  Expectorated sputum assessment w rflx to resp cult     Status: None   Collection Time: 09/20/19  5:23 AM   Specimen: Sputum  Result Value Ref Range Status   Specimen Description SPUTUM  Final   Special Requests NONE  Final   Sputum evaluation   Final    THIS SPECIMEN IS ACCEPTABLE FOR SPUTUM CULTURE Performed at [facility], [address], [place], [place] [address]    Report Status 09/20/2019 FINAL  Final  Culture, respiratory     Status: None   Collection Time: 09/20/19  5:23 AM   Specimen: SPU  Result Value Ref Range Status   Specimen Description   Final    SPUTUM Performed at [facility], [address], [place], [place] [address]    Special Requests   Final    NONE Reflexed from [phone] Performed at [facility], [address], [place], [place] [address]  Gram Stain   Final    MODERATE WBC PRESENT,BOTH PMN AND MONONUCLEAR RARE SQUAMOUS EPITHELIAL CELLS PRESENT RARE GRAM POSITIVE COCCI IN PAIRS RARE GRAM POSITIVE RODS    Culture   Final    Consistent with normal respiratory flora. Performed at [place], [place] [address] [place], [place] [address]    Report Status 09/22/2019 FINAL  Final  MRSA PCR Screening     Status: None   Collection Time: 09/20/19  1:34 PM   Specimen: Nasopharyngeal  Result Value Ref Range Status   MRSA by PCR NEGATIVE NEGATIVE Final    Comment:        The GeneXpert MRSA Assay (FDA approved for NASAL specimens only), is one component of a comprehensive MRSA colonization surveillance program. It is not intended to diagnose MRSA infection nor to guide or monitor treatment for MRSA infections. Performed at [facility], [address] [place], [place] [address]   Aerobic/Anaerobic Culture (surgical/deep wound)     Status: None (Preliminary result)   Collection Time: 09/23/19  4:59 PM   Specimen: Peritoneal Washings; Abscess  Result Value Ref Range Status   Specimen Description   Final    PERITONEAL LEFT FLUID Performed at [facility], [address] [place], [place] [address]    Special Requests   Final    NONE Performed at [facility], [place], [place] [address]    Gram Stain   Final    MODERATE WBC PRESENT,BOTH PMN AND MONONUCLEAR NO ORGANISMS SEEN    Culture   Final    NO GROWTH 4 DAYS NO ANAEROBES ISOLATED; CULTURE IN PROGRESS FOR 5 DAYS Performed at [facility], [address], [place], [place] [address]    Report Status PENDING  Incomplete     Labs: Basic Metabolic Panel: Recent Labs  Lab 09/21/19 0419 09/22/19 0336 09/23/19 0422 09/24/19 0415 09/25/19 0431 09/26/19 0536 09/27/19 0424  NA 136   < > 135 137 140 137 137  K 4.2   < > 3.4* 3.5 3.4* 3.9 4.1  CL 102   < > 95* 97* 104 104 104  CO2 25   < > 29 30 27 24 26   GLUCOSE 243*   < > 258* 194* 165* 167* 163*  BUN 18   < > 22 28* 28* 17 15  CREATININE 1.24   < > 1.49* 1.47* 1.14 1.04 1.08  CALCIUM 8.3*   < > 8.4* 8.5* 8.3* 8.4* 8.3*  MG 1.9  --   --   --   --   --   --    < > = values in this interval not displayed.   Liver  Function Tests: No results for input(s): AST, ALT, ALKPHOS, BILITOT, PROT, ALBUMIN in the last 168 hours. No results for input(s): LIPASE, AMYLASE in the last 168 hours. No results for input(s): AMMONIA in the last 168 hours. CBC: Recent Labs  Lab 09/21/19 0419 09/21/19 0419 09/22/19 0336 09/23/19 0422 09/25/19 0431 09/26/19 0536 09/27/19 0424  WBC 26.9*   < > 21.8* 16.7* 11.1* 12.9* 12.1*  NEUTROABS 23.9*  --  19.2* 13.7*  --   --   --   HGB 12.9*   < > 12.8* 12.7* 10.9* 10.9* 11.1*  HCT 36.8*   < > 38.0* 38.4* 32.7* 32.3* 31.8*  MCV 88.2   < > 89.8 90.4 89.6 89.2 86.9  PLT 238   < > 248 282  290 360 393   < > = values in this interval not displayed.   Cardiac Enzymes: No results for input(s): CKTOTAL, CKMB, CKMBINDEX, TROPONINI in the last 168 hours. BNP: BNP (last 3 results) Recent Labs    09/19/19 0519  BNP 30.8    ProBNP (last 3 results) No results for input(s): PROBNP in the last 8760 hours.  CBG: Recent Labs  Lab 09/26/19 1559 09/27/19 1204 09/27/19 1649  GLUCAP 267* 178* 118*       Signed:  Alma Friendly, MD [facility] 09/27/2019, 5:00 PM

## 2019-09-27 NOTE — Progress Notes (Signed)
Pulmonary Medicine          Date: 09/27/2019,   MRN# [id number] Alan Wolf 1950/12/07     AdmissionWeight: 70.3 kg                 CurrentWeight: 68.6 kg  Referring physician: Dr Blaine Hamper    CHIEF COMPLAINT:   Lung Mass of left lowe lobe with hilar lymphadenopathy.    SUBJECTIVE   Patient is stable and reports feeling well.  He will need to take IS home and has been encouraged to use multiple times each hour while resting.   Please d/c home with 5 more day of antibiotics - Augmentin 875/125 BID x 5 d  Follow up with [facility] in [place]   History reviewed. No pertinent past medical history.   SURGICAL HISTORY   History reviewed. No pertinent surgical history.   FAMILY HISTORY   Family History  Problem Relation Age of Onset  . Diabetes Mellitus II Mother   . Lung cancer Brother      SOCIAL HISTORY   Social History   Tobacco Use  . Smoking status: Never Smoker  . Smokeless tobacco: Never Used  Substance Use Topics  . Alcohol use: Never  . Drug use: Never     MEDICATIONS    Home Medication:    Current Medication:  Current Facility-Administered Medications:  .  0.9 %  sodium chloride infusion, , Intravenous, PRN, Sidney Ace, MD, Stopped at 09/25/19 [phone] .  acetaminophen (TYLENOL) tablet 650 mg, 650 mg, Oral, Q6H PRN, [name] B, MD, 650 mg at 09/26/19 2317 .  albuterol (PROVENTIL) (2.5 MG/3ML) 0.083% nebulizer solution 2.5 mg, 2.5 mg, Nebulization, Q4H PRN, [name], MD, 2.5 mg at 09/25/19 2013 .  alteplase (tPA) 10mg  in NS 12mL for Dr.Oaks (intrapleural administration/[facility]), , Intrapleural, Once, Tylene Fantasia, PA-C .  Ampicillin-Sulbactam (UNASYN) 3 g in sodium chloride 0.9 % 100 mL IVPB, 3 g, Intravenous, Q6H, Alma Friendly, MD, Last Rate: 200 mL/hr at 09/27/19 0522, 3 g at 09/27/19 0522 .  benzonatate (TESSALON) capsule 200 mg, 200 mg, Oral, TID, [name],  Sudheer B, MD, 200 mg at 09/26/19 2059 .  diphenhydrAMINE (BENADRYL) capsule 25 mg, 25 mg, Oral, Q6H PRN, Sreenath, Sudheer B, MD .  gabapentin (NEURONTIN) tablet 300 mg, 300 mg, Oral, TID, [name], Sudheer B, MD, 300 mg at 09/26/19 2100 .  lidocaine (LIDODERM) 5 % 1 patch, 1 patch, Transdermal, Q24H, Sreenath, Sudheer B, MD, 1 patch at 09/26/19 1510 .  MEDLINE mouth rinse, 15 mL, Mouth Rinse, BID, Sreenath, Sudheer B, MD, 15 mL at 09/25/19 0914 .  morphine 2 MG/ML injection 2 mg, 2 mg, Intravenous, Q3H PRN, [name], Sudheer B, MD, 2 mg at 09/23/19 1052 .  ondansetron (ZOFRAN) injection 4 mg, 4 mg, Intravenous, Q8H PRN, [name], MD .  oxyCODONE-acetaminophen (PERCOCET/ROXICET) 5-325 MG per tablet 1 tablet, 1 tablet, Oral, Q4H PRN, [name], MD, 1 tablet at 09/26/19 0353 .  promethazine-codeine (PHENERGAN with CODEINE) 6.25-10 MG/5ML syrup 5 mL, 5 mL, Oral, Q6H PRN, Sreenath, Sudheer B, MD    ALLERGIES   Penicillins     REVIEW OF SYSTEMS    Review of Systems:  Gen:  Denies  fever, sweats, chills weigh loss  HEENT: Denies blurred vision, double vision, ear pain, eye pain, hearing loss, nose bleeds, sore throat Cardiac:  No dizziness, chest pain or heaviness, chest tightness,edema Resp:   Denies  cough or sputum porduction, shortness of breath,wheezing, hemoptysis,  Gi: Denies swallowing difficulty, stomach pain, nausea or vomiting, diarrhea, constipation, bowel incontinence Gu:  Denies bladder incontinence, burning urine Ext:   Denies Joint pain, stiffness or swelling Skin: Denies  skin rash, easy bruising or bleeding or hives Endoc:  Denies polyuria, polydipsia , polyphagia or weight change Psych:   Denies depression, insomnia or hallucinations   Other:  All other systems negative   VS: BP (!) 154/70 (BP Location: Left Arm)   Pulse 78   Temp (!) 97.3 F (36.3 C) (Oral)   Resp 20   Ht 5\' 11"  (1.803 m)   Wt 68.6 kg   SpO2 100%   BMI 21.09 kg/m      PHYSICAL EXAM      GENERAL:NAD, no fevers, chills, no weakness no fatigue HEAD: Normocephalic, atraumatic.  EYES: Pupils equal, round, reactive to light. Extraocular muscles intact. No scleral icterus.  MOUTH: Moist mucosal membrane. Dentition intact. No abscess noted.  EAR, NOSE, THROAT: Clear without exudates. No external lesions.  NECK: Supple. No thyromegaly. No nodules. No JVD.  PULMONARY: clear to auscultation with minimal rhonchi at left lung CARDIOVASCULAR: S1 and S2. Regular rate and rhythm. No murmurs, rubs, or gallops. No edema. Pedal pulses 2+ bilaterally.  GASTROINTESTINAL: Soft, nontender, nondistended. No masses. Positive bowel sounds. No hepatosplenomegaly.  MUSCULOSKELETAL: No swelling, clubbing, or edema. Range of motion full in all extremities.  NEUROLOGIC: Cranial nerves II through XII are intact. No gross focal neurological deficits. Sensation intact. Reflexes intact.  SKIN: No ulceration, lesions, rashes, or cyanosis. Skin warm and dry. Turgor intact.  PSYCHIATRIC: Mood, affect within normal limits. The patient is awake, alert and oriented x 3. Insight, judgment intact.       IMAGING    DG Chest 1 View  Result Date: 09/21/2019 CLINICAL DATA:  69 year old with progressive shortness of breath with exertion over night and acute respiratory failure. Fever and cough. EXAM: Portable CHEST 1 VIEW COMPARISON:  CTA chest 09/19/2019. FINDINGS: Since the CT 2 days ago, marked increase in size of the now very large LEFT pleural effusion. Worsening dense consolidation in the LEFT LOWER LOBE and lingula. New small RIGHT pleural effusion and associated mild passive atelectasis in the RIGHT LOWER LOBE. RIGHT lung remains clear otherwise. Heart moderately enlarged. Pulmonary vascularity normal. IMPRESSION: 1. Marked increase in size of the now large LEFT pleural effusion since the CT 2 days ago. 2. Worsening dense atelectasis and/or pneumonia in the LEFT LOWER LOBE and lingula. 3. New small RIGHT  pleural effusion and associated mild passive atelectasis in the RIGHT LOWER LOBE. Electronically Signed   By: Evangeline Dakin M.D.   On: 09/21/2019 12:49   DG Chest 2 View  Result Date: 09/26/2019 CLINICAL DATA:  Chest tube EXAM: CHEST - 2 VIEW COMPARISON:  09/25/2019 FINDINGS: Left chest tube remains present with pigtail in similar position. Similar appearance of left mid and lower lung opacity. Similar residual small pleural effusions and bibasilar atelectasis. No pneumothorax. Stable cardiomediastinal contours. No acute osseous abnormality. IMPRESSION: No substantial change since 09/25/2019. Electronically Signed   By: Macy Mis M.D.   On: 09/26/2019 08:06   DG Chest 2 View  Result Date: 09/25/2019 CLINICAL DATA:  Chest pain around chest tube site EXAM: CHEST - 2 VIEW COMPARISON:  09/24/2019 FINDINGS: Pigtail LEFT thoracostomy tube again identified. Normal heart size, mediastinal contours, and pulmonary vascularity. Atherosclerotic calcification aorta. Bibasilar small pleural effusions and atelectasis slightly greater on RIGHT, with significantly decreased  LEFT pleural effusion since previous exam. Decreased infiltrate LEFT mid lung. No pneumothorax or acute osseous findings. IMPRESSION: Decreased LEFT pleural effusion and basilar atelectasis since prior study. Small residual bibasilar effusions and atelectasis. Improved LEFT midlung infiltrate. Electronically Signed   By: Lavonia Dana M.D.   On: 09/25/2019 08:24   CT CHEST W CONTRAST  Result Date: 09/23/2019 CLINICAL DATA:  69 year old male with abnormal x-ray. Pleural effusion. EXAM: CT CHEST WITH CONTRAST TECHNIQUE: Multidetector CT imaging of the chest was performed during intravenous contrast administration. CONTRAST:  78mL OMNIPAQUE IOHEXOL 300 MG/ML  SOLN COMPARISON:  Chest CT dated 09/19/2019 and radiograph dated 09/22/2019. FINDINGS: Cardiovascular: There is no cardiomegaly or pericardial effusion. Coronary vascular calcification primarily  involving the LAD. The thoracic aorta is unremarkable. The origins of the great vessels of the aortic arch are patent as visualized. Evaluation of the pulmonary arteries is limited due to suboptimal visualization of the distal branches. No large or central pulmonary artery embolus identified. Mediastinum/Nodes: Mildly enlarged right hilar lymph node measures 12 mm in short axis. Subcarinal lymph node measures 11 mm. The esophagus is grossly unremarkable. No mediastinal fluid collection. Lungs/Pleura: There is a large left pleural effusion with probable areas of loculation. There is compressive atelectasis of the majority of the left lung with a small aerated portion of the left upper lobe. Small pockets of air within the effusion inferiorly may be related to recent procedure or represent an infectious process. A bronchopleural fistula is not excluded. There is a small right pleural effusion. Partial consolidative changes involving the right lung base and right middle lobe may represent atelectasis or infiltrate. Smaller ground-glass clusters in the right upper lobe may represent pneumonia. There is no pneumothorax. The central airways are patent. Upper Abdomen: No acute abnormality. Musculoskeletal: No chest wall abnormality. No acute or significant osseous findings. IMPRESSION: 1. No central pulmonary artery embolus identified. 2. Large left pleural effusion with consolidation of the majority of the left lung. Small pockets of air within the effusion inferiorly may be related to recent procedure or represent an infectious process. A bronchopleural fistula is not excluded. 3. Small right pleural effusion. 4. Partial consolidative changes involving the right lung base and right middle lobe may represent atelectasis or infiltrate. Smaller ground-glass clusters in the right upper lobe may represent pneumonia. Clinical correlation and follow-up recommended. 5. Mildly enlarged right hilar and subcarinal lymph nodes,  likely reactive. 6. Aortic Atherosclerosis (ICD10-I70.0). Electronically Signed   By: Anner Crete M.D.   On: 09/23/2019 15:46   CT Angio Chest PE W and/or Wo Contrast  Result Date: 09/19/2019 CLINICAL DATA:  Chest pain with pleurisy or effusion suspected EXAM: CT ANGIOGRAPHY CHEST WITH CONTRAST TECHNIQUE: Multidetector CT imaging of the chest was performed using the standard protocol during bolus administration of intravenous contrast. Multiplanar CT image reconstructions and MIPs were obtained to evaluate the vascular anatomy. CONTRAST:  66mL OMNIPAQUE IOHEXOL 350 MG/ML SOLN COMPARISON:  Abdominal CT 09/12/2019 FINDINGS: Cardiovascular: Normal heart size. No pericardial effusion. Atheromatous calcification along the aorta and left main coronary. Mediastinum/Nodes: Mildly enlarged bronchial lymph nodes the left lower lobe, 11 mm in diameter. Lungs/Pleura: Ovoid area of decreased enhancement and vessel attenuation measuring up to 5.7 cm on axial slices. There is adjacent enhancing compressed appearing lung. Small left pleural effusion with some scalloping. Upper Abdomen: Negative Musculoskeletal: Negative for bone erosion. No evidence of osseous metastatic disease. Review of the MIP images confirms the above findings. IMPRESSION: 1. Concern for left lower lobe mass with  bronchial adenopathy. Recommend referral to multi disciplinary thoracic oncology. Findings are not definitive for malignancy and could be related to round atelectasis or atypical pneumonia, consider antibiotic therapy in the meantime. 2. Small left pleural effusion complicated by areas of loculation. 3. Negative for pulmonary embolism. Electronically Signed   By: Monte Fantasia M.D.   On: 09/19/2019 06:44   [place] CHEST (PLEURAL EFFUSION)  Result Date: 09/23/2019 CLINICAL DATA:  Concern for worsening left-sided pleural effusion. EXAM: CHEST ULTRASOUND COMPARISON:  Chest CT-09/19/2019; chest radiograph-09/22/2019 FINDINGS: Sonographic  evaluation of the left hemithorax demonstrates apparent dense consolidation of the left lung (favored) versus densely loculated pleural fluid, not amenable to ultrasound-guided thoracentesis. No thoracentesis attempted. IMPRESSION: Dense consolidation of the left lung (favored) versus densely loculated pleural fluid, not amenable to ultrasound-guided thoracentesis. No thoracentesis attempted. Recommend proceeding with repeat contrast-enhanced chest CT for further evaluation. Critical Value/emergent results were relayed to referring physician via epic messenger at the time of interpretation on 09/23/2019 at 10:37 am to provider [facility] , who acknowledged these results. Electronically Signed   By: Sandi Mariscal M.D.   On: 09/23/2019 10:38   US Venous Img Lower Bilateral (DVT)  Result Date: 09/19/2019 CLINICAL DATA:  Elevated D-dimer.  Evaluate for DVT. EXAM: BILATERAL LOWER EXTREMITY VENOUS DOPPLER ULTRASOUND TECHNIQUE: Gray-scale sonography with graded compression, as well as color Doppler and duplex ultrasound were performed to evaluate the lower extremity deep venous systems from the level of the common femoral vein and including the common femoral, femoral, profunda femoral, popliteal and calf veins including the posterior tibial, peroneal and gastrocnemius veins when visible. The superficial great saphenous vein was also interrogated. Spectral Doppler was utilized to evaluate flow at rest and with distal augmentation maneuvers in the common femoral, femoral and popliteal veins. COMPARISON:  None. FINDINGS: RIGHT LOWER EXTREMITY Common Femoral Vein: No evidence of thrombus. Normal compressibility, respiratory phasicity and response to augmentation. Saphenofemoral Junction: No evidence of thrombus. Normal compressibility and flow on color Doppler imaging. Profunda Femoral Vein: No evidence of thrombus. Normal compressibility and flow on color Doppler imaging. Femoral Vein: No evidence of thrombus. Normal  compressibility, respiratory phasicity and response to augmentation. Popliteal Vein: No evidence of thrombus. Normal compressibility, respiratory phasicity and response to augmentation. Calf Veins: No evidence of thrombus. Normal compressibility and flow on color Doppler imaging. Superficial Great Saphenous Vein: No evidence of thrombus. Normal compressibility. Venous Reflux:  None. Other Findings:  None. LEFT LOWER EXTREMITY Common Femoral Vein: No evidence of thrombus. Normal compressibility, respiratory phasicity and response to augmentation. Saphenofemoral Junction: No evidence of thrombus. Normal compressibility and flow on color Doppler imaging. Profunda Femoral Vein: No evidence of thrombus. Normal compressibility and flow on color Doppler imaging. Femoral Vein: No evidence of thrombus. Normal compressibility, respiratory phasicity and response to augmentation. Popliteal Vein: No evidence of thrombus. Normal compressibility, respiratory phasicity and response to augmentation. Calf Veins: No evidence of thrombus. Normal compressibility and flow on color Doppler imaging. Superficial Great Saphenous Vein: No evidence of thrombus. Normal compressibility. Venous Reflux:  None. Other Findings:  None. IMPRESSION: No evidence of DVT within either lower extremity. Electronically Signed   By: Sandi Mariscal M.D.   On: 09/19/2019 12:29   DG Chest Port 1 View  Result Date: 09/24/2019 CLINICAL DATA:  Chest tube placement EXAM: PORTABLE CHEST 1 VIEW COMPARISON:  09/22/2019 FINDINGS: A left chest tube has been place with evacuation of much of the left pleural effusion. A moderate-sized effusion remains. Residual consolidation or atelectasis in the left  lung. Linear atelectasis in the right lung base. Heart size is partly obscured but appears normal. IMPRESSION: Interval placement of left chest tube with evacuation of much of the left pleural effusion. Residual consolidation or atelectasis in the left lung. Electronically  Signed   By: Lucienne Capers M.D.   On: 09/24/2019 03:24   DG Chest Port 1 View  Result Date: 09/22/2019 CLINICAL DATA:  Pleural effusion. EXAM: PORTABLE CHEST 1 VIEW COMPARISON:  Chest x-ray dated 09/21/2019. FINDINGS: Near complete opacification of the LEFT hemithorax, stable to slightly increased compared to yesterday's chest x-ray. Probable small RIGHT pleural effusion and/or RIGHT basilar atelectasis. RIGHT lung remains otherwise clear. No pneumothorax is seen. Cardiomediastinal size and position is grossly stable. IMPRESSION: 1. Near complete opacification of the LEFT hemithorax, stable to slightly increased compared to yesterday's chest x-ray, significantly increased compared to the chest CT angiogram of 09/19/2019. This is presumably a combination of large pleural effusion and LEFT lower lung pneumonia versus atelectasis. 2. Probable small pleural effusion and/or atelectasis at the RIGHT lung base. Electronically Signed   By: Franki Cabot M.D.   On: 09/22/2019 12:09   ECHOCARDIOGRAM COMPLETE  Result Date: 09/22/2019    ECHOCARDIOGRAM REPORT   Patient Name:   EFSTATHIOS SAWIN Date of Exam: 09/22/2019 Medical Rec #:  [id number]        Height:       71.0 in Accession #:    [id number]       Weight:       151.2 lb Date of Birth:  01-03-1951        BSA:          1.872 m Patient Age:    69 years         BP:           120/69 mmHg Patient Gender: M                HR:           94 bpm. Exam Location:  [facility] Procedure: 2D Echo Indications:     CHF-Acute Diastolic 932.35/T73.22  History:         Patient has no prior history of Echocardiogram examinations.  Sonographer:     Avanell Shackleton Referring Phys:  [id number] Sidney Ace Diagnosing Phys: Serafina Royals MD IMPRESSIONS  1. Left ventricular ejection fraction, by estimation, is 65 to 70%. The left ventricle has normal function. The left ventricle has no regional wall motion abnormalities. Left ventricular diastolic parameters were normal.  2. Right ventricular  systolic function is normal. The right ventricular size is normal. There is moderately elevated pulmonary artery systolic pressure.  3. The mitral valve is normal in structure. Trivial mitral valve regurgitation.  4. The aortic valve is normal in structure. Aortic valve regurgitation is trivial. FINDINGS  Left Ventricle: Left ventricular ejection fraction, by estimation, is 65 to 70%. The left ventricle has normal function. The left ventricle has no regional wall motion abnormalities. The left ventricular internal cavity size was small. There is no left ventricular hypertrophy. Left ventricular diastolic parameters were normal. Right Ventricle: The right ventricular size is normal. No increase in right ventricular wall thickness. Right ventricular systolic function is normal. There is moderately elevated pulmonary artery systolic pressure. The tricuspid regurgitant velocity is 3.16 m/s, and with an assumed right atrial pressure of 10 mmHg, the estimated right ventricular systolic pressure is 62.3 mmHg. Left Atrium: Left atrial size was normal in size. Right Atrium: Right atrial size was  normal in size. Pericardium: There is no evidence of pericardial effusion. Mitral Valve: The mitral valve is normal in structure. Trivial mitral valve regurgitation. Tricuspid Valve: The tricuspid valve is normal in structure. Tricuspid valve regurgitation is not demonstrated. Aortic Valve: The aortic valve is normal in structure. Aortic valve regurgitation is trivial. Pulmonic Valve: The pulmonic valve was normal in structure. Pulmonic valve regurgitation is not visualized. Aorta: The aortic root and ascending aorta are structurally normal, with no evidence of dilitation. IAS/Shunts: No atrial level shunt detected by color flow Doppler.  LEFT VENTRICLE PLAX 2D LVIDd:         4.26 cm  Diastology LVIDs:         2.64 cm  LV e' lateral:   8.05 cm/s LV PW:         0.68 cm  LV E/e' lateral: 8.7 LV IVS:        0.71 cm  LV e' medial:     8.05 cm/s LVOT diam:     2.00 cm  LV E/e' medial:  8.7 LVOT Area:     3.14 cm  RIGHT VENTRICLE             IVC RV S prime:     19.70 cm/s  IVC diam: 2.32 cm TAPSE (M-mode): 2.8 cm LEFT ATRIUM             Index       RIGHT ATRIUM           Index LA diam:        2.80 cm 1.50 cm/m  RA Area:     16.00 cm LA Vol (A2C):   35.9 ml 19.17 ml/m RA Volume:   41.20 ml  22.00 ml/m LA Vol (A4C):   35.0 ml 18.69 ml/m LA Biplane Vol: 35.1 ml 18.75 ml/m   AORTA Ao Root diam: 3.00 cm MITRAL VALVE               TRICUSPID VALVE MV Area (PHT): 2.39 cm    TR Peak grad:   39.9 mmHg MV Decel Time: 318 msec    TR Vmax:        316.00 cm/s MV E velocity: 70.00 cm/s MV A velocity: 93.00 cm/s  SHUNTS MV E/A ratio:  0.75        Systemic Diam: 2.00 cm Serafina Royals MD Electronically signed by Serafina Royals MD Signature Date/Time: 09/22/2019/7:30:23 PM    Final    CT RENAL STONE STUDY  Result Date: 09/12/2019 CLINICAL DATA:  LEFT flank and LEFT lower quadrant pain for 10-12 days, no history of kidney stones or surgery EXAM: CT ABDOMEN AND PELVIS WITHOUT CONTRAST TECHNIQUE: Multidetector CT imaging of the abdomen and pelvis was performed following the standard protocol without IV contrast. Sagittal and coronal MPR images reconstructed from axial data set. No oral contrast. COMPARISON:  None FINDINGS: Lower chest: Small LEFT pleural effusion with partial atelectasis of LEFT lower lobe. Hepatobiliary: Gallbladder and liver normal appearance Pancreas: Normal appearance Spleen: Normal appearance Adrenals/Urinary Tract: Adrenal glands, kidneys, ureters, and bladder normal appearance per Stomach/Bowel: Normal appendix. Minimally prominent stool in rectum. Large and small bowel loops otherwise unremarkable. No definite gastric abnormalities. Vascular/Lymphatic: Atherosclerotic calcifications aorta and iliac arteries without aneurysm. No adenopathy. Reproductive: Prostatic enlargement, gland measuring 4.7 x 4.3 cm image 72. Other: No free air  or free fluid. No inflammatory process. Small RIGHT inguinal hernia containing fat. Musculoskeletal: Small lucent focus within posterior aspect of L4 vertebral body RIGHT of midline,  nonspecific. No additional osseous abnormalities. IMPRESSION: Small LEFT pleural effusion with partial atelectasis of LEFT lower lobe. Small RIGHT inguinal hernia containing fat. Prostatic enlargement. No acute intra-abdominal or intrapelvic abnormalities. Small nonspecific lucent focus within the posterior aspect of the L4 vertebral body, nonspecific, cannot exclude myeloma or potentially lytic metastasis; correlation with workup for myeloma recommended. Aortic Atherosclerosis (ICD10-I70.0). Electronically Signed   By: Lavonia Dana M.D.   On: 09/12/2019 11:00   CT IMAGE GUIDED FLUID DRAIN BY CATHETER  Result Date: 09/23/2019 INDICATION: Complex left-sided pleural effusion. Request made for image guided placement of left-sided chest tube prior to potential definitive VATS. EXAM: CT IMAGE GUIDED FLUID DRAIN BY CATHETER COMPARISON:  Chest CT-earlier same day; 09/19/2019: Chest ultrasound-09/23/2019 MEDICATIONS: The patient is currently admitted to the hospital and receiving intravenous antibiotics. The antibiotics were administered within an appropriate time frame prior to the initiation of the procedure. ANESTHESIA/SEDATION: Moderate (conscious) sedation was employed during this procedure. A total of Versed 1 mg and Fentanyl 50 mcg was administered intravenously. Moderate Sedation Time: 25 minutes. The patient's level of consciousness and vital signs were monitored continuously by radiology nursing throughout the procedure under my direct supervision. CONTRAST:  None COMPLICATIONS: None immediate. PROCEDURE: Informed written consent was obtained from the patient after a discussion of the risks, benefits and alternatives to treatment. The patient was placed supine on the CT gantry and a pre procedural CT was performed re-demonstrating  the known large partially loculated left-sided pleural effusion. The procedure was planned. A timeout was performed prior to the initiation of the procedure. The skin overlying the inferolateral aspect the left lower chest was prepped and draped in the usual sterile fashion. The overlying soft tissues were anesthetized with 1% lidocaine with epinephrine. Appropriate trajectory was planned with the use of a 22 gauge spinal needle. An 18 gauge trocar needle was advanced into the abscess/fluid collection and a short Amplatz super stiff wire was coiled within the collection. Appropriate positioning was confirmed with a limited CT scan. The tract was serially dilated allowing placement of a 16 French all-purpose drainage catheter. Appropriate positioning was confirmed with a limited postprocedural CT scan. Approximately 650 cc ml of dark yellow fluid was aspirated. The tube was connected to a pleura vac device and sutured in place. A dressing was placed. The patient tolerated the procedure well without immediate post procedural complication. IMPRESSION: Successful CT guided placement of a 82 French all purpose drain catheter into the left pleural space with aspiration of 650 cc of dark yellow pleural fluid. Samples were sent to the laboratory as requested by the ordering clinical team. Electronically Signed   By: Sandi Mariscal M.D.   On: 09/23/2019 17:09      ASSESSMENT/PLAN   Left lung mass with associated hilar lymphadenopathy   - possible infectious vs malignant in etiology    - agree with empiric tx for CAP-zosyn   - s/p chest tube insertion - fluid cultures negative - serosang active draining without air leak    - will need interval imaging /PET to re-evaluate in 8 wks post recovery    - discussed and plan for bronchoscopic evaluation     -agree with workup including stre pneumoniae and legionella    - fungitell is in process    -procalcitonin is elevated at 4.92 -nasal MRSA in process   -leukocytosis  has resolved       Left lung empyema and Atelectasis  -will add recruitment maneuvers via MetaNEB q8h with albuterol  and  mucomyst 33ml bid 20% to regimen  -s/p Chest tube with active serosang low volume   -plan for repeat imaging and removal of Chest tube  -MEtaneb q4h   Thank you for allowing me to participate in the care of this patient.    Patient/Family are satisfied with care plan and all questions have been answered.  This document was prepared using Dragon voice recognition software and may include unintentional dictation errors.     Ottie Glazier, M.D.  Division of [facility]

## 2019-09-27 NOTE — Plan of Care (Signed)

## 2019-09-28 LAB — AEROBIC/ANAEROBIC CULTURE W GRAM STAIN (SURGICAL/DEEP WOUND): Culture: NO GROWTH

## 2019-11-12 ENCOUNTER — Other Ambulatory Visit: Payer: Self-pay | Admitting: Pulmonary Disease

## 2019-11-12 DIAGNOSIS — R918 Other nonspecific abnormal finding of lung field: Secondary | ICD-10-CM

## 2019-12-05 ENCOUNTER — Ambulatory Visit
Admission: RE | Admit: 2019-12-05 | Discharge: 2019-12-05 | Disposition: A | Discharge status: Home / Self Care | Payer: Medicare HMO | Source: Ambulatory Visit | Attending: Pulmonary Disease | Admitting: Pulmonary Disease

## 2019-12-05 ENCOUNTER — Other Ambulatory Visit: Payer: Self-pay

## 2019-12-05 DIAGNOSIS — R918 Other nonspecific abnormal finding of lung field: Secondary | ICD-10-CM

## 2019-12-05 LAB — POCT I-STAT CREATININE: Creatinine, Ser: 1.3 mg/dL — ABNORMAL HIGH (ref 0.61–1.24)

## 2019-12-05 MED ORDER — IOHEXOL 300 MG/ML  SOLN
75.0000 mL | Freq: Once | INTRAMUSCULAR | Status: AC | PRN
  Administered 2019-12-05: 75 mL via INTRAVENOUS

## 2021-12-04 IMAGING — CT CT IMAGE GUIDED FLUID DRAIN BY CATHETER
1 of 3 series · 12 of 32 positions shown, 18 images · non-contrast
Comparison: Chest CT-earlier same day;

INDICATION: Complex left-sided pleural effusion. Request made for image guided
placement of left-sided chest tube prior to potential definitive
VATS.

EXAM:
CT IMAGE GUIDED FLUID DRAIN BY CATHETER

[Series 2: i-spiral 5.0 b30f · axial · 0.68mm/px · z∈[+318,+510]mm · 12 of 65 slices shown, 18 images]
[im 5/65  soft-tissue]
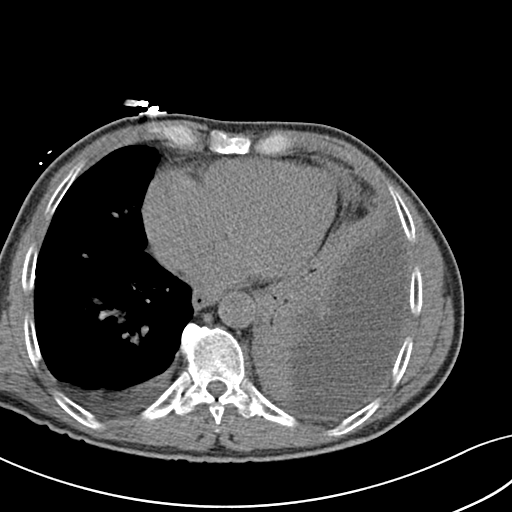
[im 5/65  bone]
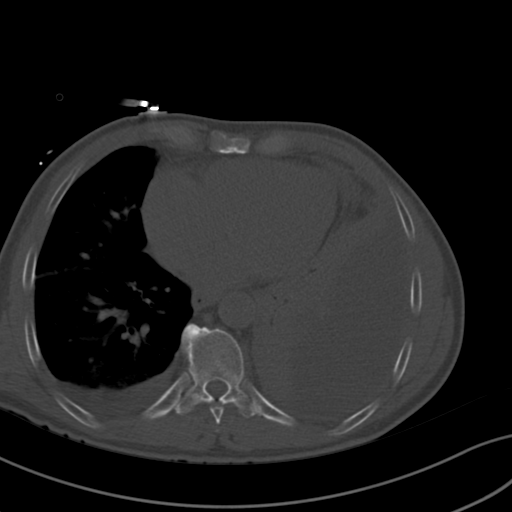
[im 10/65  soft-tissue]
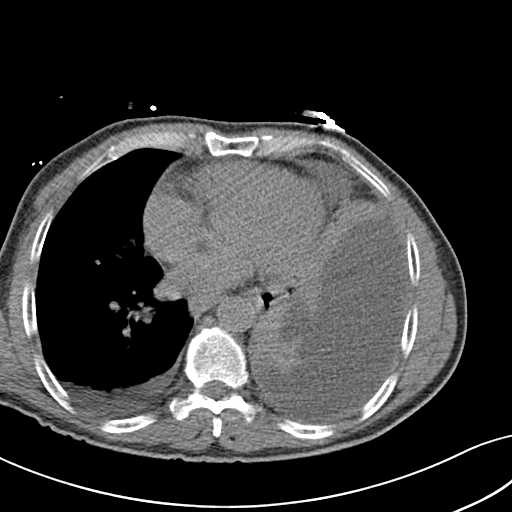
[im 14/65  soft-tissue]
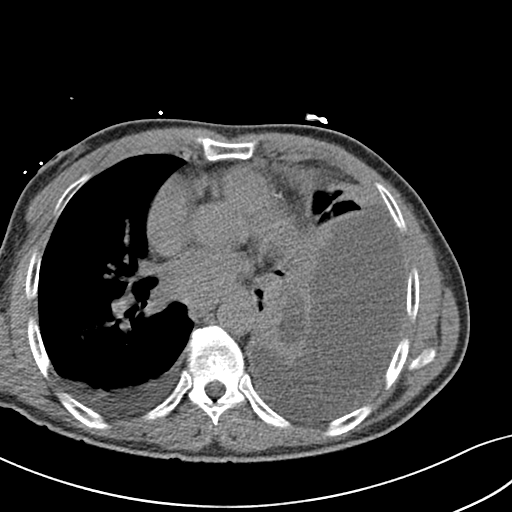
[im 19/65  soft-tissue]
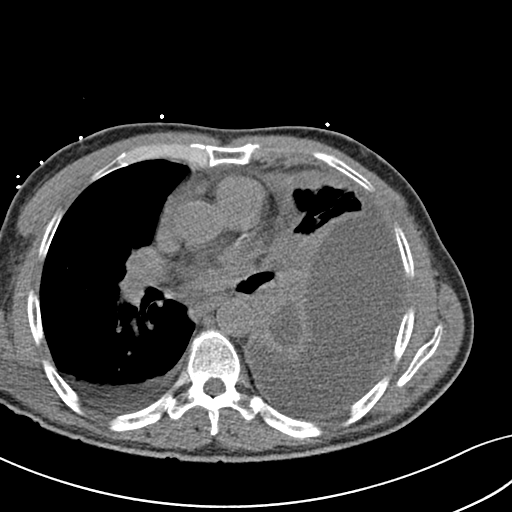
[im 23/65  soft-tissue]
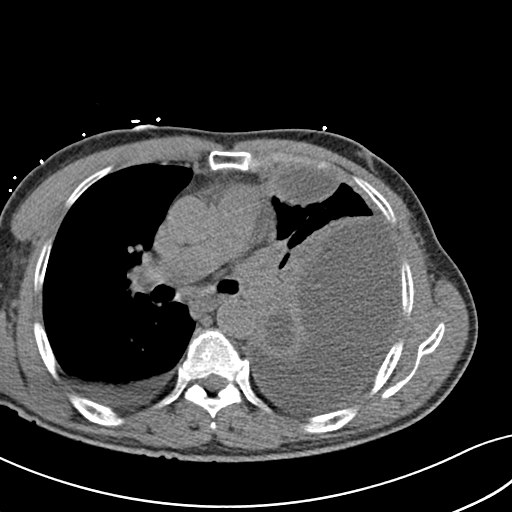
[im 28/65  soft-tissue]
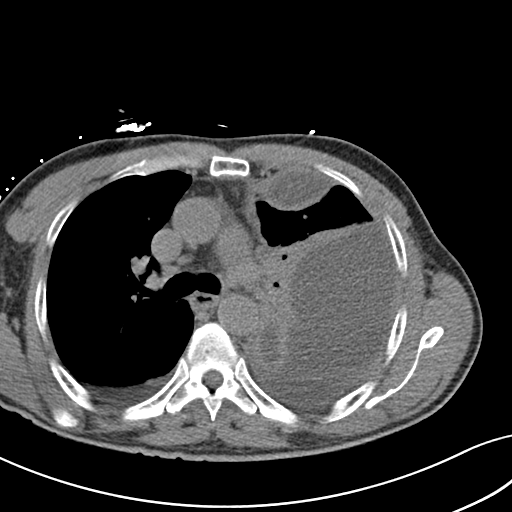
[im 37/65  soft-tissue]
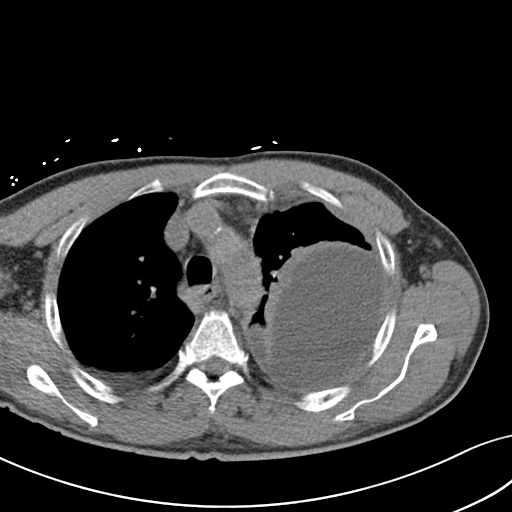
[im 42/65  soft-tissue]
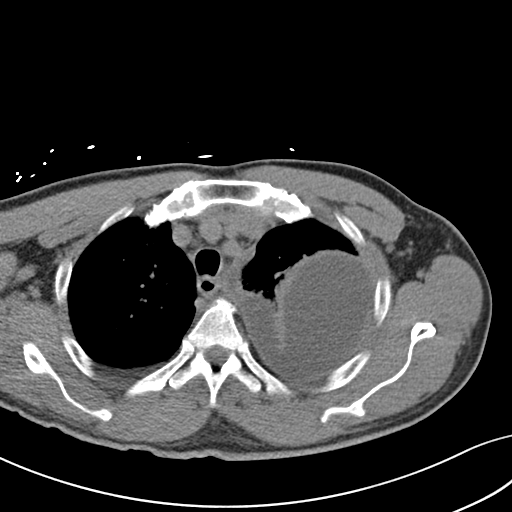
[im 46/65  soft-tissue]
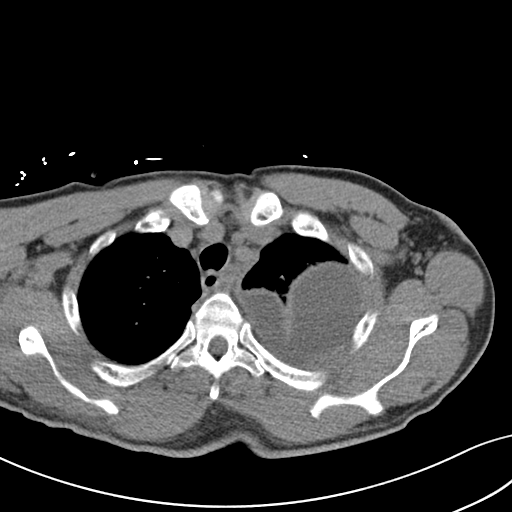
[im 46/65  lung]
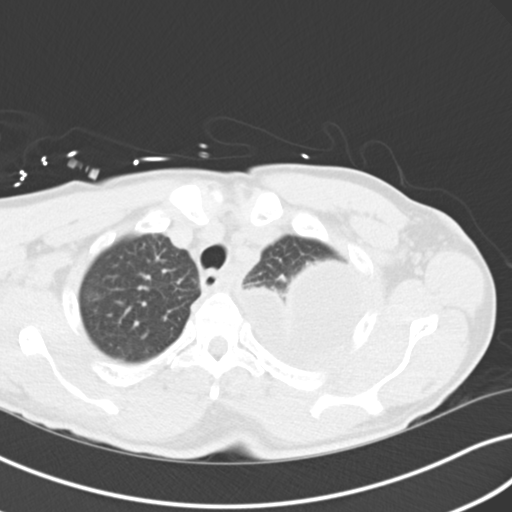
[im 46/65  bone]
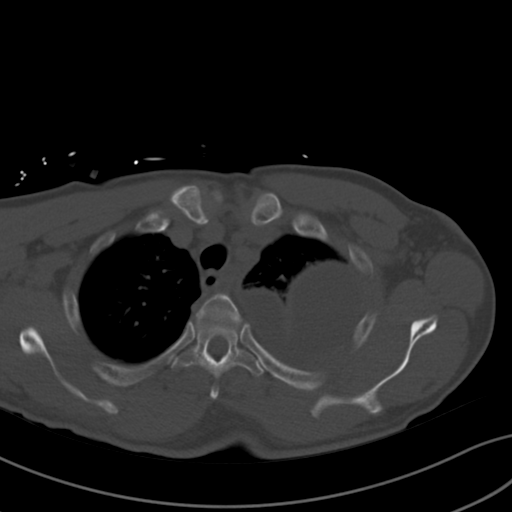
[im 51/65  soft-tissue]
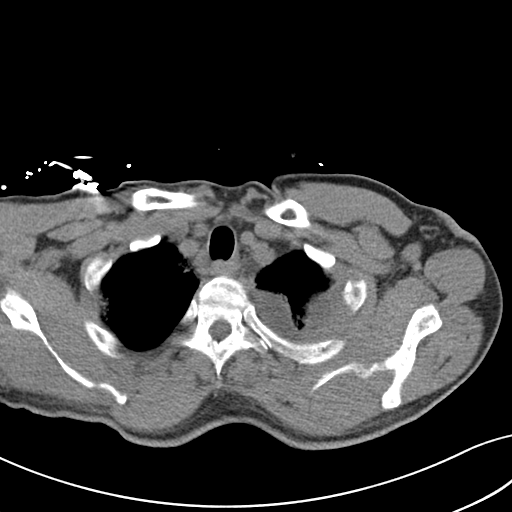
[im 51/65  lung]
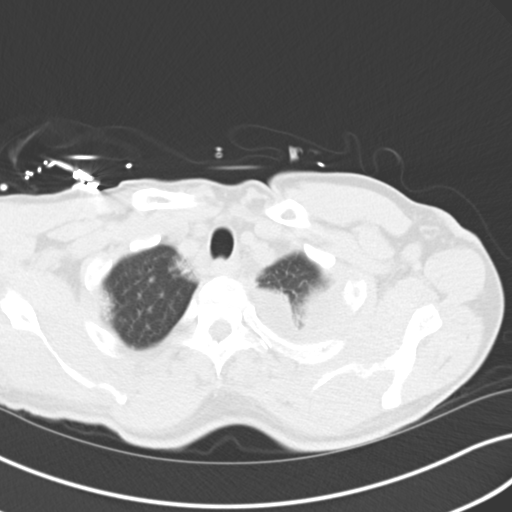
[im 55/65  soft-tissue]
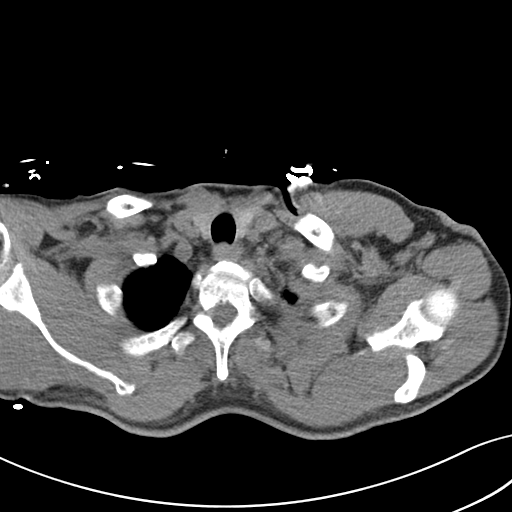
[im 55/65  lung]
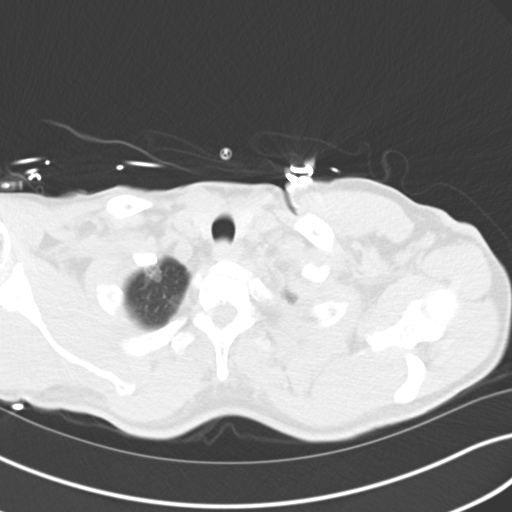
[im 60/65  soft-tissue]
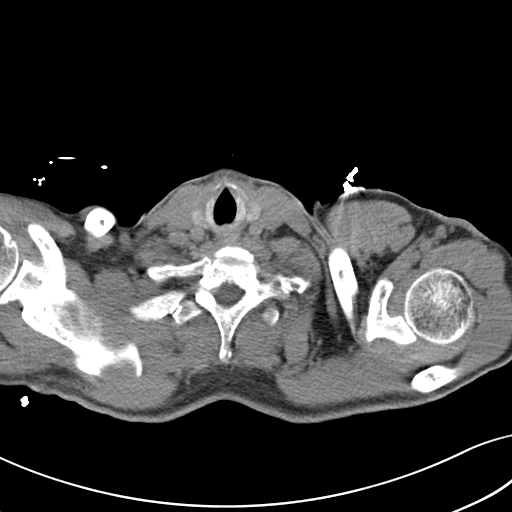
[im 60/65  lung]
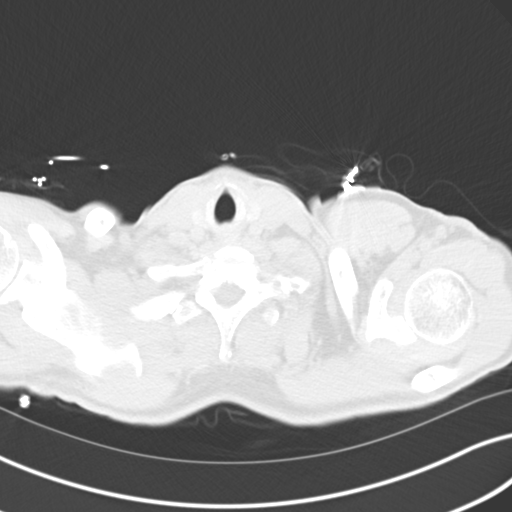

[12 of 32 positions shown; findings below may reference images not displayed]

09/19/2019: Chest
ultrasound-09/23/2019

MEDICATIONS:
The patient is currently admitted to the hospital and receiving
intravenous antibiotics. The antibiotics were administered within an
appropriate time frame prior to the initiation of the procedure.

ANESTHESIA/SEDATION:
Moderate (conscious) sedation was employed during this procedure. A
total of Versed 1 mg and Fentanyl 50 mcg was administered
intravenously.

Moderate Sedation Time: 25 minutes. The patient's level of
consciousness and vital signs were monitored continuously by
radiology nursing throughout the procedure under my direct
supervision.

CONTRAST:  None

COMPLICATIONS:
None immediate.

PROCEDURE:
Informed written consent was obtained from the patient after a
discussion of the risks, benefits and alternatives to treatment. The
patient was placed supine on the CT gantry and a pre procedural CT
was performed re-demonstrating the known large partially loculated
left-sided pleural effusion. The procedure was planned. A timeout
was performed prior to the initiation of the procedure.

The skin overlying the inferolateral aspect the left lower chest was
prepped and draped in the usual sterile fashion. The overlying soft
tissues were anesthetized with 1% lidocaine with epinephrine.
Appropriate trajectory was planned with the use of a 22 gauge spinal
needle. An 18 gauge trocar needle was advanced into the
abscess/fluid collection and a short Amplatz super stiff wire was
coiled within the collection. Appropriate positioning was confirmed
with a limited CT scan. The tract was serially dilated allowing
placement of a 16 French all-purpose drainage catheter. Appropriate
positioning was confirmed with a limited postprocedural CT scan.

Approximately 650 cc ml of dark yellow fluid was aspirated. The tube
was connected to a pleura vac device and sutured in place. A
dressing was placed. The patient tolerated the procedure well
without immediate post procedural complication.
IMPRESSION: Successful CT guided placement of a 16 French all purpose drain
catheter into the left pleural space with aspiration of 650 cc of
dark yellow pleural fluid. Samples were sent to the laboratory as
requested by the ordering clinical team.

## 2021-12-06 IMAGING — CR DG CHEST 2V
2 series · 2 of 2 positions shown · non-contrast
Comparison: 09/24/2019

CLINICAL DATA: Chest pain around chest tube site

EXAM:
CHEST - 2 VIEW

[chest pa]
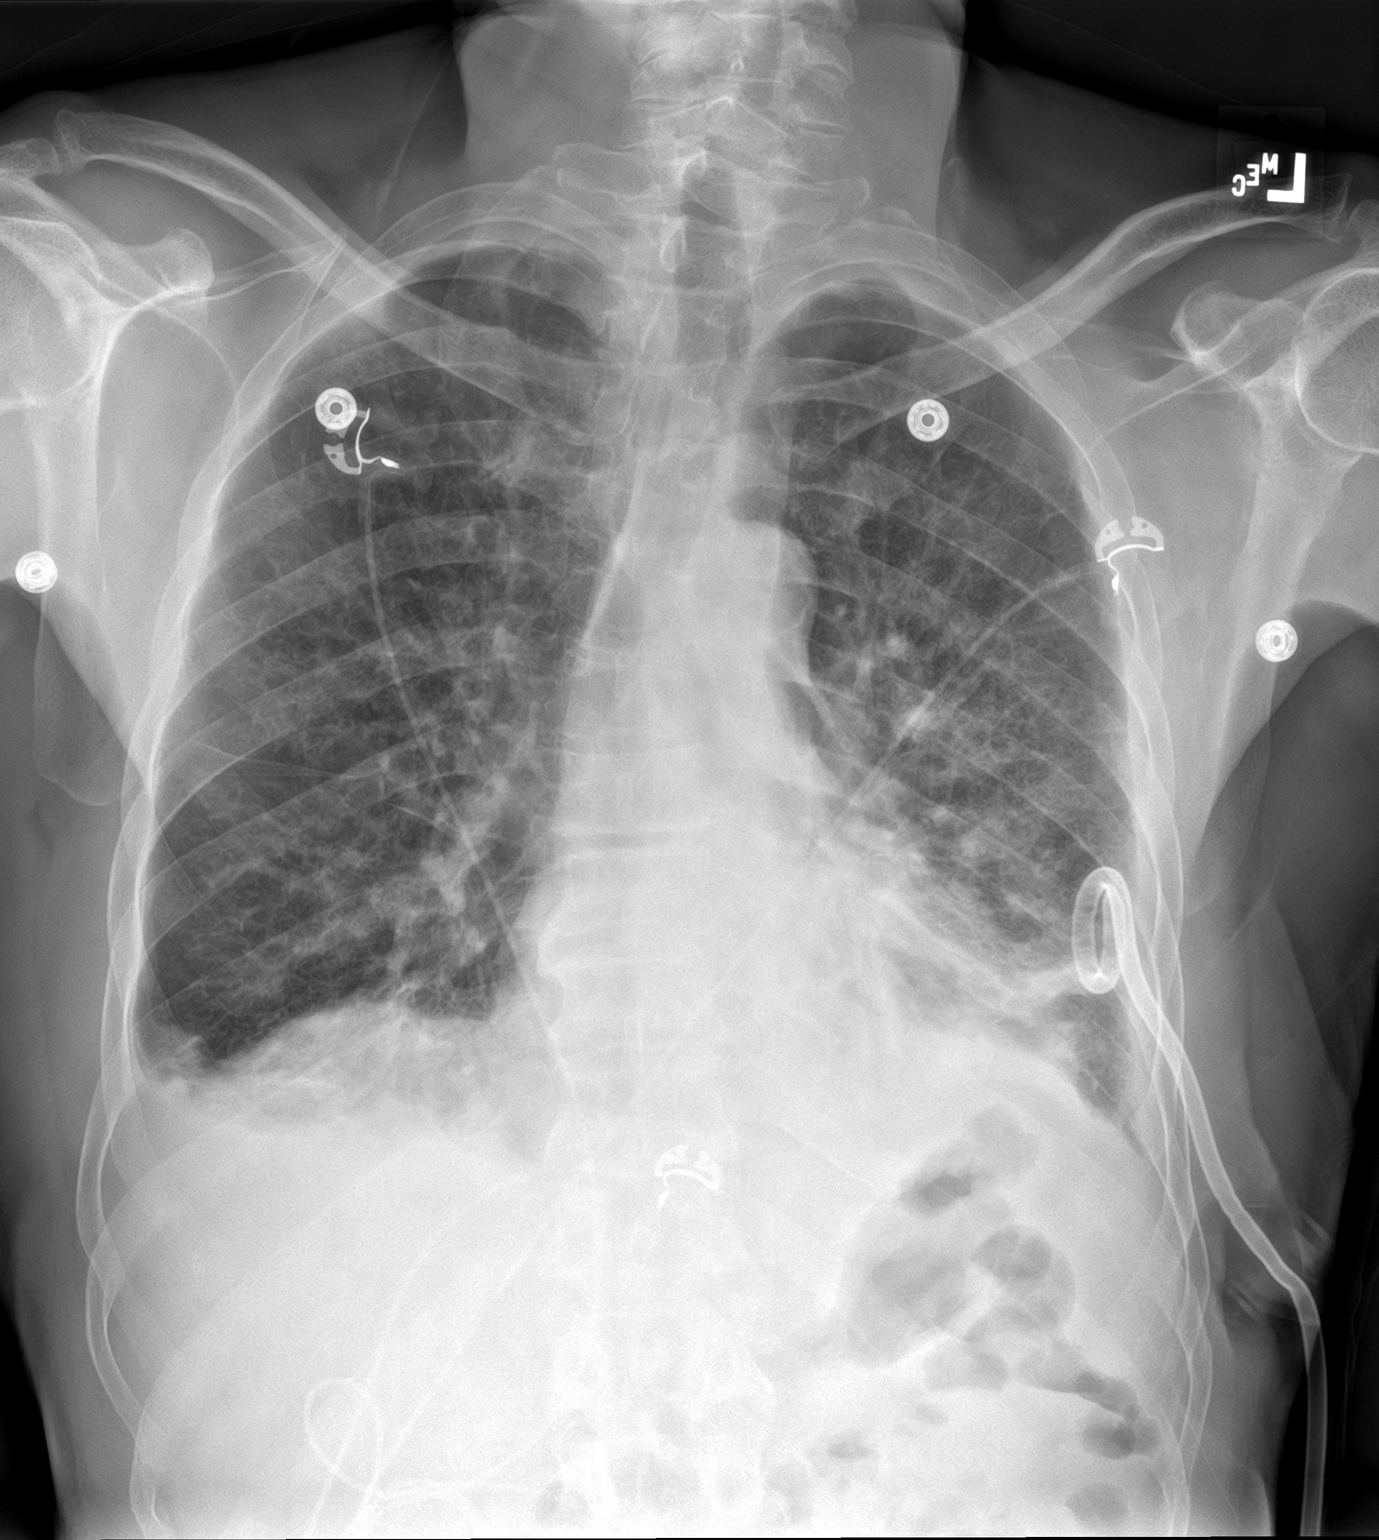

[chest lat]
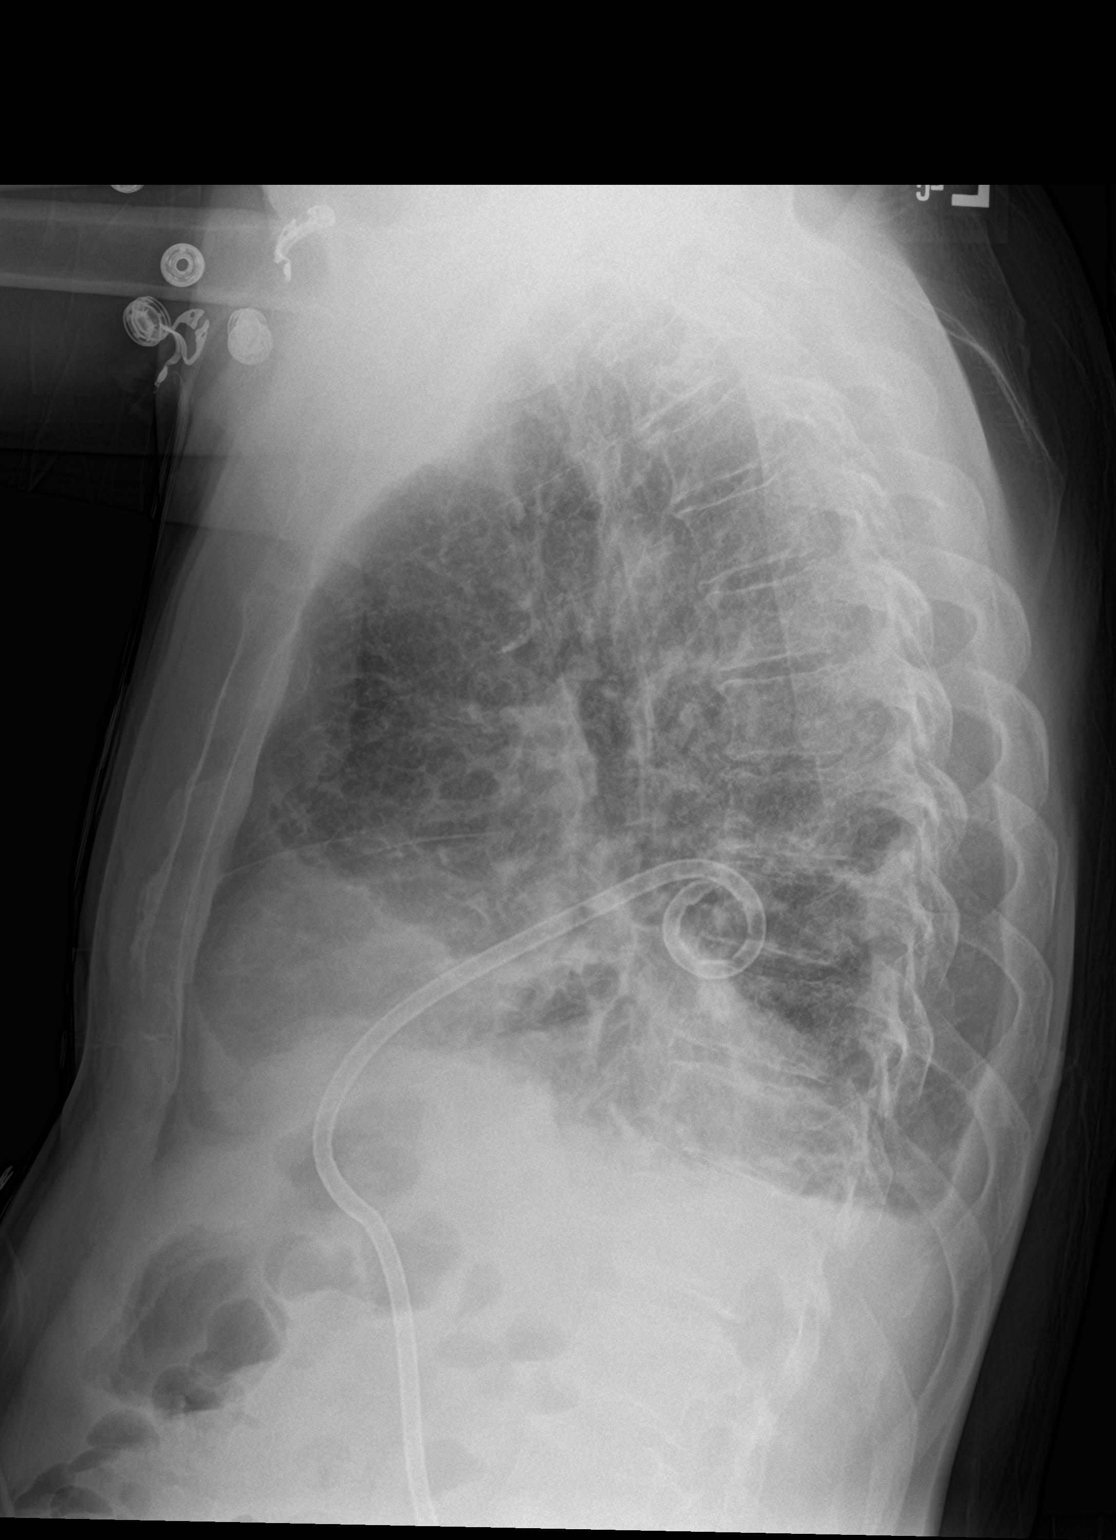

[2 of 2 positions shown; findings below may reference images not displayed]

FINDINGS: Pigtail LEFT thoracostomy tube again identified.

Normal heart size, mediastinal contours, and pulmonary vascularity.

Atherosclerotic calcification aorta.

Bibasilar small pleural effusions and atelectasis slightly greater
on RIGHT, with significantly decreased LEFT pleural effusion since
previous exam.

Decreased infiltrate LEFT mid lung.

No pneumothorax or acute osseous findings.
IMPRESSION: Decreased LEFT pleural effusion and basilar atelectasis since prior
study.

Small residual bibasilar effusions and atelectasis.

Improved LEFT midlung infiltrate.

## 2023-06-22 ENCOUNTER — Encounter: Payer: Self-pay | Admitting: Cardiology

## 2023-06-22 ENCOUNTER — Ambulatory Visit: Attending: Cardiology | Admitting: Cardiology

## 2023-06-22 VITALS — BP 146/64 | HR 59 | Ht 71.0 in | Wt 148.0 lb

## 2023-06-22 DIAGNOSIS — E7800 Pure hypercholesterolemia, unspecified: Secondary | ICD-10-CM

## 2023-06-22 DIAGNOSIS — R001 Bradycardia, unspecified: Secondary | ICD-10-CM | POA: Diagnosis not present

## 2023-06-22 DIAGNOSIS — I10 Essential (primary) hypertension: Secondary | ICD-10-CM

## 2023-06-22 NOTE — Progress Notes (Signed)
 Cardiology Office Note:    Date:  06/22/2023   ID:  Alan Wolf, DOB January 17, 1951, MRN [id number]  PCP:  Macie Saxon, MD   [facility] Providers Cardiologist:  None     Referring MD: Macie Saxon, MD   No chief complaint on file.   History of Present Illness:    Alan Wolf is a 73 y.o. male with a hx of hypertension, hyperlipidemia, former smoker x 20 years who presents for cardiac evaluation.  Denies chest pain or shortness of breath.  Compliant with medications as prescribed.  Lost his wife about a year ago.  Blood pressure is adequately controlled at home with systolics in the 120s to 130s.  Patient interested in engaging in sexual activity, wants to make sure cardiac function is okay.  Saw cardiology in 2021 due to cardiac murmur.  Has testicular pain, saw her primary care physician who referred patient to urology for possible epididymitis.  Outside echo 2021 EF> 55%, mild MR.  Past Medical History:  Diagnosis Date   Hyperlipidemia    Hypertension     Past Surgical History:  Procedure Laterality Date   COLONOSCOPY     TOOTH EXTRACTION     WISDOM TOOTH EXTRACTION      Current Medications: Current Meds  Medication Sig   lisinopril (ZESTRIL) 10 MG tablet LISINOPRIL 10 MG TABS     Allergies:   Penicillins   Social History   Socioeconomic History   Marital status: Married    Spouse name: Not on file   Number of children: Not on file   Years of education: Not on file   Highest education level: Not on file  Occupational History   Not on file  Tobacco Use   Smoking status: Former    Current packs/day: 0.00    Types: Cigarettes    Quit date: 1990    Years since quitting: 35.3   Smokeless tobacco: Never  Vaping Use   Vaping status: Never Used  Substance and Sexual Activity   Alcohol use: Not Currently    Comment: quit 30 years   Drug use: Never   Sexual activity: Not Currently  Other Topics Concern   Not on file  Social History  Narrative   Remote smoker 30ppd; quit year ago; [place]/family-wife [dementia] used to work in [employer].    Social Drivers of Corporate investment banker Strain: Not on file  Food Insecurity: Not on file  Transportation Needs: Not on file  Physical Activity: Not on file  Stress: Not on file  Social Connections: Not on file     Family History: The patient's family history includes Dementia in his mother; Diabetes Mellitus II in his mother; Lung cancer in his brother; Stroke in his father.  ROS:   Please see the history of present illness.     All other systems reviewed and are negative.  EKGs/Labs/Other Studies Reviewed:    The following studies were reviewed today:  EKG Interpretation Date/Time:  Thursday Jun 22 2023 09:30:38 EDT Ventricular Rate:  59 PR Interval:  144 QRS Duration:  72 QT Interval:  358 QTC Calculation: 354 R Axis:   50  Text Interpretation: Sinus bradycardia Confirmed by Constancia Delton ([id number]) on 06/22/2023 9:40:04 AM    Recent Labs: No results found for requested labs within last 365 days.  Recent Lipid Panel No results found for: "CHOL", "TRIG", "HDL", "CHOLHDL", "VLDL", "LDLCALC", "LDLDIRECT"  Outside lipid panel/3/25 total cholesterol 153, triglycerides 69, HDL  60, LDL 79  Risk Assessment/Calculations:         Physical Exam:    VS:  BP (!) 146/64   Pulse (!) 59   Ht 5\' 11"  (1.803 m)   Wt 148 lb (67.1 kg)   SpO2 96%   BMI 20.64 kg/m     Wt Readings from Last 3 Encounters:  06/22/23 148 lb (67.1 kg)  09/19/19 151 lb 3.8 oz (68.6 kg)     GEN:  Well nourished, well developed in no acute distress HEENT: Normal NECK: No JVD; No carotid bruits CARDIAC: RRR, no murmurs, rubs, gallops RESPIRATORY:  Clear to auscultation without rales, wheezing or rhonchi  ABDOMEN: Soft, non-tender, non-distended MUSCULOSKELETAL:  No edema; No deformity  SKIN: Warm and dry NEUROLOGIC:  Alert and oriented x 3 PSYCHIATRIC:  Normal affect    ASSESSMENT:    1. Bradycardia   2. Primary hypertension   3. Pure hypercholesterolemia    PLAN:    In order of problems listed above:  Bradycardia, history of systolic murmur.  No murmur noted on exam.  Obtain echo to rule out any structural abnormalities.  Patient also wants to engage in sexual activity.  Okay from a cardiac perspective.  EF is normal. Hypertension, BP elevated today, usually controlled.  Continue lisinopril 10 mg daily. Hyperlipidemia, cholesterol controlled.  Continue Lipitor 20 mg daily.  Follow-up after echo.      Medication Adjustments/Labs and Tests Ordered: Current medicines are reviewed at length with the patient today.  Concerns regarding medicines are outlined above.  Orders Placed This Encounter  Procedures   EKG 12-Lead   ECHOCARDIOGRAM COMPLETE   No orders of the defined types were placed in this encounter.   Patient Instructions  Medication Instructions:  Your Physician recommend you continue on your current medication as directed.    *If you need a refill on your cardiac medications before your next appointment, please call your pharmacy*  Lab Work: No labs ordered today  If you have labs (blood work) drawn today and your tests are completely normal, you will receive your results only by: MyChart Message (if you have MyChart) OR A paper copy in the mail If you have any lab test that is abnormal or we need to change your treatment, we will call you to review the results.  Testing/Procedures: Your physician has requested that you have an echocardiogram. Echocardiography is a painless test that uses sound waves to create images of your heart. It provides your doctor with information about the size and shape of your heart and how well your heart's chambers and valves are working.   You may receive an ultrasound enhancing agent through an IV if needed to better visualize your heart during the echo. This procedure takes approximately one  hour.  There are no restrictions for this procedure.  This will take place at [address] [facility] [address]) [address], [place] [address]  Please note: We ask at that you not bring children with you during ultrasound (echo/ vascular) testing. Due to room size and safety concerns, children are not allowed in the ultrasound rooms during exams. Our front office staff cannot provide observation of children in our lobby area while testing is being conducted. An adult accompanying a patient to their appointment will only be allowed in the ultrasound room at the discretion of the ultrasound technician under special circumstances. We apologize for any inconvenience.   Follow-Up: At [facility], you and your health needs are our priority.  As part of our continuing mission to provide you with exceptional heart care, our providers are all part of one team.  This team includes your primary Cardiologist (physician) and Advanced Practice Providers or APPs (Physician Assistants and Nurse Practitioners) who all work together to provide you with the care you need, when you need it.  Your next appointment:   3 month(s)  Provider:   You may see Dr. Junnie Olives or one of the following Advanced Practice Providers on your designated Care Team:   Laneta Pintos, NP Gildardo Labrador, PA-C Varney Gentleman, PA-C Cadence [place], PA-C Ronald Cockayne, NP Morey Ar, NP    We recommend signing up for the patient portal called "MyChart".  Sign up information is provided on this After Visit Summary.  MyChart is used to connect with patients for Virtual Visits (Telemedicine).  Patients are able to view lab/test results, encounter notes, upcoming appointments, etc.  Non-urgent messages can be sent to your provider as well.   To learn more about what you can do with MyChart, go to [email or web address].     Signed, Constancia Delton, MD  06/22/2023 12:38 PM    [facility]

## 2023-06-22 NOTE — Patient Instructions (Signed)
 Medication Instructions:  Your Physician recommend you continue on your current medication as directed.    *If you need a refill on your cardiac medications before your next appointment, please call your pharmacy*  Lab Work: No labs ordered today  If you have labs (blood work) drawn today and your tests are completely normal, you will receive your results only by: MyChart Message (if you have MyChart) OR A paper copy in the mail If you have any lab test that is abnormal or we need to change your treatment, we will call you to review the results.  Testing/Procedures: Your physician has requested that you have an echocardiogram. Echocardiography is a painless test that uses sound waves to create images of your heart. It provides your doctor with information about the size and shape of your heart and how well your heart's chambers and valves are working.   You may receive an ultrasound enhancing agent through an IV if needed to better visualize your heart during the echo. This procedure takes approximately one hour.  There are no restrictions for this procedure.  This will take place at [address] [facility] [address]) [address], [place] [address]  Please note: We ask at that you not bring children with you during ultrasound (echo/ vascular) testing. Due to room size and safety concerns, children are not allowed in the ultrasound rooms during exams. Our front office staff cannot provide observation of children in our lobby area while testing is being conducted. An adult accompanying a patient to their appointment will only be allowed in the ultrasound room at the discretion of the ultrasound technician under special circumstances. We apologize for any inconvenience.   Follow-Up: At [facility], you and your health needs are our priority.  As part of our continuing mission to provide you with exceptional heart care, our providers are all part of one team.  This team includes your  primary Cardiologist (physician) and Advanced Practice Providers or APPs (Physician Assistants and Nurse Practitioners) who all work together to provide you with the care you need, when you need it.  Your next appointment:   3 month(s)  Provider:   You may see Dr. Junnie Olives or one of the following Advanced Practice Providers on your designated Care Team:   Laneta Pintos, NP Gildardo Labrador, PA-C Varney Gentleman, PA-C Cadence [place], PA-C Ronald Cockayne, NP Morey Ar, NP    We recommend signing up for the patient portal called "MyChart".  Sign up information is provided on this After Visit Summary.  MyChart is used to connect with patients for Virtual Visits (Telemedicine).  Patients are able to view lab/test results, encounter notes, upcoming appointments, etc.  Non-urgent messages can be sent to your provider as well.   To learn more about what you can do with MyChart, go to [email or web address].

## 2023-07-04 ENCOUNTER — Ambulatory Visit (INDEPENDENT_AMBULATORY_CARE_PROVIDER_SITE_OTHER): Admitting: Urology

## 2023-07-04 ENCOUNTER — Other Ambulatory Visit
Admission: RE | Admit: 2023-07-04 | Discharge: 2023-07-04 | Disposition: A | Discharge status: Home / Self Care | Source: Ambulatory Visit | Attending: Urology | Admitting: Urology

## 2023-07-04 VITALS — BP 123/73 | HR 58 | Ht 71.0 in | Wt 150.0 lb

## 2023-07-04 DIAGNOSIS — N451 Epididymitis: Secondary | ICD-10-CM | POA: Diagnosis not present

## 2023-07-04 DIAGNOSIS — M7989 Other specified soft tissue disorders: Secondary | ICD-10-CM | POA: Insufficient documentation

## 2023-07-04 DIAGNOSIS — R1030 Lower abdominal pain, unspecified: Secondary | ICD-10-CM

## 2023-07-04 LAB — D-DIMER, QUANTITATIVE: D-Dimer, Quant: 0.51 ug{FEU}/mL — ABNORMAL HIGH (ref 0.00–0.50)

## 2023-07-04 MED ORDER — DOXYCYCLINE HYCLATE 100 MG PO CAPS: 100.0000 mg | ORAL_CAPSULE | Freq: Two times a day (BID) | ORAL | 0 refills | Status: DC

## 2023-07-04 NOTE — Progress Notes (Signed)
 I,Amy L Pierron,acting as a scribe for Dustin Gimenez, MD.,have documented all relevant documentation on the behalf of Dustin Gimenez, MD,as directed by  Dustin Gimenez, MD while in the presence of Dustin Gimenez, MD.  07/04/2023 5:33 PM   Alan Wolf 08/01/1950 [id number]  Referring provider: Macie Saxon, MD [address] [place],  [place] [address]  Chief Complaint  Patient presents with   Establish Care    patient complaining of pain in groin area     HPI: 73 year-old male year old male with a personal history of diabetes presents today for further evaluation of scrotal pain.   He was initially seen at [facility] on 05/30/2023 for groin pain. He was treated prior to that at urgent care for presumed epididymitis and was treated with Keflex, later switched to a 10-day course of Doxycycline.   His urinalysis today is negative.  He reports that his testicles were sore, particularly when sitting or walking, and that the pain sometimes radiates upwards. He has been taking Tylenol  and Ibuprofen for pain management.   He denies any changes in urination, though he occasionally experiences burning after urination. Denies any penile discharge. He reports that the left epididymis remains enlarged and tender.   Additionally, he mentions having left calf pain that began suddenly without any known injury. The pain is constant, and there is tenderness in the calf but no swelling. He denies any history of blood clots.   He is concerned about the possibility of a blood clot and is scheduled for a D-dimer test today and an ultrasound tomorrow.  Results for orders placed or performed during the hospital encounter of 07/04/23  D-dimer, quantitative  Result Value Ref Range   D-Dimer, Quant 0.51 (H) 0.00 - 0.50 ug/mL-FEU    PMH: Past Medical History:  Diagnosis Date   Hyperlipidemia    Hypertension     Surgical History: Past Surgical History:  Procedure Laterality  Date   COLONOSCOPY     TOOTH EXTRACTION     WISDOM TOOTH EXTRACTION      Home Medications:  Allergies as of 07/04/2023       Reactions   Penicillins    Per pt - when a child, it made the fever go up instead of down        Medication List        Accurate as of Jul 04, 2023  5:33 PM. If you have any questions, ask your nurse or doctor.          atorvastatin 20 MG tablet Commonly known as: LIPITOR Take 20 mg by mouth daily.   benzonatate  200 MG capsule Commonly known as: TESSALON  Take 1 capsule (200 mg total) by mouth 3 (three) times daily.   doxycycline 100 MG capsule Commonly known as: VIBRAMYCIN Take 1 capsule (100 mg total) by mouth 2 (two) times daily. Started by: Dustin Gimenez   guaiFENesin -codeine  100-10 MG/5ML syrup Take 5 mLs by mouth as directed.   lisinopril 10 MG tablet Commonly known as: ZESTRIL Take 5 mg by mouth daily.        Allergies:  Allergies  Allergen Reactions   Penicillins     Per pt - when a child, it made the fever go up instead of down    Family History: Family History  Problem Relation Age of Onset   Diabetes Mellitus II Mother    Dementia Mother    Stroke Father    Lung cancer Brother  Social History:  reports that he quit smoking about 35 years ago. His smoking use included cigarettes. He has never used smokeless tobacco. He reports that he does not currently use alcohol. He reports that he does not use drugs.   Physical Exam: BP 123/73   Pulse (!) 58   Ht 5\' 11"  (1.803 m)   Wt 150 lb (68 kg)   BMI 20.92 kg/m   Constitutional:  Alert and oriented, No acute distress. HEENT: [place] AT, moist mucus membranes.  Trachea midline, no masses. GU: Enlarged and tender left epididymis Neurologic: Grossly intact, no focal deficits, moving all 4 extremities. Psychiatric: Normal mood and affect.  Results for orders placed or performed in visit on 07/04/23  Microscopic Examination   Collection Time: 07/04/23  4:09 PM    Urine  Result Value Ref Range   WBC, UA 0-5 0 - 5 /hpf   RBC, Urine 0-2 0 - 2 /hpf   Epithelial Cells (non renal) 0-10 0 - 10 /hpf   Bacteria, UA Few None seen/Few  Urinalysis, Complete   Collection Time: 07/04/23  4:09 PM  Result Value Ref Range   Specific Gravity, UA 1.010 1.005 - 1.030   pH, UA 6.0 5.0 - 7.5   Color, UA Yellow Yellow   Appearance Ur Clear Clear   Leukocytes,UA Negative Negative   Protein,UA Negative Negative/Trace   Glucose, UA Negative Negative   Ketones, UA Negative Negative   RBC, UA Negative Negative   Bilirubin, UA Negative Negative   Urobilinogen, Ur 0.2 0.2 - 1.0 mg/dL   Nitrite, UA Negative Negative   Microscopic Examination Comment    Microscopic Examination See below:      Assessment & Plan  1. Chronic Epididymitis - Persistent left epididymal pain and tenderness, initially treated with Keflex and then Doxycycline. There is no history of imaging or further testing. The left epididymis remains enlarged and tender.   - The plan is to continue with another 10-day course of Doxycycline. Advised to wear tight-fitting underwear or compression shorts for scrotal support to reduce inflammation. Ibuprofen is recommended twice daily to manage inflammation, with caution regarding potential side effects on kidneys and stomach.  2. Left Calf Pain - Unilateral, constant left calf pain without any known injury or trauma. There is tenderness but no swelling on examination.   - The differential diagnosis includes deep vein thrombosis (DVT), although suspicion is low. A D-dimer test will be drawn today to assess for DVT. An ultrasound of the leg is scheduled for tomorrow to rule out DVT, and he is advised to keep the appointment despite prior commitments. If the D-dimer is low or normal, the ultrasound may not be necessary.  Return in about 2 weeks (around 07/18/2023).if not improved  I have reviewed the above documentation for accuracy and completeness, and I agree  with the above.   Dustin Gimenez, MD   [facility] [address] [place], [place] [address] [phone]

## 2023-07-05 ENCOUNTER — Telehealth: Payer: Self-pay

## 2023-07-05 ENCOUNTER — Ambulatory Visit
Admission: RE | Admit: 2023-07-05 | Discharge: 2023-07-05 | Disposition: A | Discharge status: Home / Self Care | Source: Ambulatory Visit | Attending: Urology | Admitting: Urology

## 2023-07-05 DIAGNOSIS — M7989 Other specified soft tissue disorders: Secondary | ICD-10-CM | POA: Insufficient documentation

## 2023-07-05 LAB — URINALYSIS, COMPLETE
Bilirubin, UA: NEGATIVE
Glucose, UA: NEGATIVE
Ketones, UA: NEGATIVE
Leukocytes,UA: NEGATIVE
Nitrite, UA: NEGATIVE
Protein,UA: NEGATIVE
RBC, UA: NEGATIVE
Specific Gravity, UA: 1.01 (ref 1.005–1.030)
Urobilinogen, Ur: 0.2 mg/dL (ref 0.2–1.0)
pH, UA: 6 (ref 5.0–7.5)

## 2023-07-05 LAB — MICROSCOPIC EXAMINATION

## 2023-07-05 NOTE — Telephone Encounter (Signed)
 Per Dr Ace Holder D Dimer result is just a bit above normal 0.51, she is comfortable with patient not getting an Ultrasound done today if he does not want to but ok to proceed on the cautious side otherwise. Patient would like to keep his appointment for Ultrasound today.

## 2023-07-18 ENCOUNTER — Encounter: Payer: Self-pay | Admitting: Urology

## 2023-07-18 ENCOUNTER — Ambulatory Visit: Admitting: Urology

## 2023-07-18 VITALS — BP 148/66 | HR 55 | Ht 71.0 in | Wt 153.0 lb

## 2023-07-18 DIAGNOSIS — N451 Epididymitis: Secondary | ICD-10-CM

## 2023-07-18 DIAGNOSIS — B356 Tinea cruris: Secondary | ICD-10-CM | POA: Diagnosis not present

## 2023-07-18 MED ORDER — DOXYCYCLINE HYCLATE 100 MG PO CAPS: 100.0000 mg | ORAL_CAPSULE | Freq: Two times a day (BID) | ORAL | 0 refills | Status: AC

## 2023-07-18 NOTE — Progress Notes (Signed)
 07/18/2023 1:50 PM   Alan Wolf Sep 04, 1950 [id number]  Referring provider: Macie Saxon, MD [address] [place],  [place] [address]  Urological history: 1. Chronic epididymitis - > one month - Treat with Keflex and doxycycline   Chief Complaint  Patient presents with   Other   Follow-up   HPI: Alan Wolf is a 73 y.o. man who presents today for 2-week follow-up for chronic epididymitis.  Previous records reviewed.   He continues to have bilateral groin itching and he was prescribed ketoconazole by his PCP which irritated his glans and he switched to nystatin which he is tolerating better.  He states he still has itching in the groin area.  He also has testicular pain.  He states that when he manipulates the testicle that is when it is uncomfortable.  He stated he stopped his doxycycline  prematurely as he states he was feeling better.  Patient denies any modifying or aggravating factors.  Patient denies any recent UTI's, gross hematuria, dysuria or suprapubic/flank pain.  Patient denies any fevers, chills, nausea or vomiting.    PMH: Past Medical History:  Diagnosis Date   Hyperlipidemia    Hypertension     Surgical History: Past Surgical History:  Procedure Laterality Date   COLONOSCOPY     TOOTH EXTRACTION     WISDOM TOOTH EXTRACTION      Home Medications:  Allergies as of 07/18/2023       Reactions   Penicillins    Per pt - when a child, it made the fever go up instead of down        Medication List        Accurate as of Jul 18, 2023  1:50 PM. If you have any questions, ask your nurse or doctor.          atorvastatin 20 MG tablet Commonly known as: LIPITOR Take 20 mg by mouth daily.   doxycycline  100 MG capsule Commonly known as: VIBRAMYCIN  Take 1 capsule (100 mg total) by mouth 2 (two) times daily.   guaiFENesin -codeine  100-10 MG/5ML syrup Take 5 mLs by mouth as directed.   lisinopril 10 MG tablet Commonly known as:  ZESTRIL Take 5 mg by mouth daily.        Allergies:  Allergies  Allergen Reactions   Penicillins     Per pt - when a child, it made the fever go up instead of down    Family History: Family History  Problem Relation Age of Onset   Diabetes Mellitus II Mother    Dementia Mother    Stroke Father    Lung cancer Brother     Social History:  reports that he quit smoking about 35 years ago. His smoking use included cigarettes. He has never used smokeless tobacco. He reports that he does not currently use alcohol. He reports that he does not use drugs.  ROS: Pertinent ROS in HPI  Physical Exam: BP (!) 148/66   Pulse (!) 55   Ht 5\' 11"  (1.803 m)   Wt 153 lb (69.4 kg)   BMI 21.34 kg/m   Constitutional:  Well nourished. Alert and oriented, No acute distress. HEENT: [place] AT, moist mucus membranes.  Trachea midline Cardiovascular: No clubbing, cyanosis, or edema. Respiratory: Normal respiratory effort, no increased work of breathing. GU: No CVA tenderness.  No bladder fullness or masses.  Patient with circumcised phallus.   Urethral meatus is patent.  No penile discharge. No penile lesions or rashes. Scrotum without lesions,  cysts, rashes and/or edema.  Testicles are located scrotally bilaterally. No masses are appreciated in the testicles.  Right epididymis are normal.  Left epididymis is tender.  Neurologic: Grossly intact, no focal deficits, moving all 4 extremities. Psychiatric: Normal mood and affect.  Laboratory Data: N/A  Pertinent Imaging: N/A  Assessment & Plan:    1.  Left epididymitis - Left abdomen is still tender and patient stopped his antibiotic prematurely - I will send in a refill for the doxycycline  he will finish the few capsules he has left - He will continue ibuprofen for the discomfort - I also schedule a scrotal ultrasound for further evaluation as his epididymitis seems to be persistent, but this may be due to the fact that he discontinue antibiotics  prematurely  2.  Tinea cruris - Advised him to continue with the nystatin cream, but he needs to apply it twice daily which she has not been doing - Will recheck on follow-up  Return in about 1 month (around 08/18/2023) for Scrotal US  report .  These notes generated with voice recognition software. I apologize for typographical errors.  Briant Camper  [facility] [place] [facility] [address]  [address] [place], [place] [address] [phone]

## 2023-07-21 ENCOUNTER — Ambulatory Visit: Admitting: Urology

## 2023-07-24 ENCOUNTER — Other Ambulatory Visit: Payer: Self-pay | Admitting: Cardiology

## 2023-07-24 ENCOUNTER — Ambulatory Visit
Admission: RE | Admit: 2023-07-24 | Discharge: 2023-07-24 | Disposition: A | Discharge status: Home / Self Care | Source: Ambulatory Visit | Attending: Urology | Admitting: Urology

## 2023-07-24 DIAGNOSIS — I10 Essential (primary) hypertension: Secondary | ICD-10-CM

## 2023-07-24 DIAGNOSIS — E7800 Pure hypercholesterolemia, unspecified: Secondary | ICD-10-CM

## 2023-07-24 DIAGNOSIS — N451 Epididymitis: Secondary | ICD-10-CM | POA: Insufficient documentation

## 2023-07-24 DIAGNOSIS — R001 Bradycardia, unspecified: Secondary | ICD-10-CM

## 2023-08-07 ENCOUNTER — Ambulatory Visit: Attending: Cardiology

## 2023-08-07 ENCOUNTER — Ambulatory Visit: Admitting: Cardiology

## 2023-08-07 DIAGNOSIS — I340 Nonrheumatic mitral (valve) insufficiency: Secondary | ICD-10-CM

## 2023-08-07 DIAGNOSIS — R001 Bradycardia, unspecified: Secondary | ICD-10-CM | POA: Diagnosis not present

## 2023-08-07 DIAGNOSIS — I10 Essential (primary) hypertension: Secondary | ICD-10-CM | POA: Diagnosis not present

## 2023-08-07 LAB — ECHOCARDIOGRAM COMPLETE
AR max vel: 2 cm2
AV Area VTI: 2.01 cm2
AV Area mean vel: 1.85 cm2
AV Mean grad: 4 mmHg
AV Peak grad: 8.4 mmHg
Ao pk vel: 1.45 m/s
Area-P 1/2: 3.03 cm2
S' Lateral: 2.71 cm

## 2023-08-08 ENCOUNTER — Ambulatory Visit: Payer: Self-pay | Admitting: Cardiology

## 2023-08-16 NOTE — Progress Notes (Deleted)
 08/17/2023 10:25 PM   Alan Wolf 1950-08-24 [id number]  Referring provider: Buren Rock HERO, MD [address] [place],  [place] [address]  Urological history: 1. Chronic epididymitis - > one month - Treat with Keflex and doxycycline   No chief complaint on file.  HPI: Alan Wolf is a 73 y.o. man who presents today for scrotal ultrasound report  Previous records reviewed.   At his visit on 07/18/2023, he continues to have bilateral groin itching and he was prescribed ketoconazole by his PCP which irritated his glans and he switched to nystatin which he is tolerating better.  He states he still has itching in the groin area.  He also has testicular pain.  He states that when he manipulates the testicle that is when it is uncomfortable.  He stated he stopped his doxycycline  prematurely as he states he was feeling better.  Patient denies any modifying or aggravating factors.  Patient denies any recent UTI's, gross hematuria, dysuria or suprapubic/flank pain.  Patient denies any fevers, chills, nausea or vomiting.  He was given a refill on the doxycycline  and an scrotal ultrasound was ordered.   He was also given nystatin cream for tinea cruris.    Scrotal US  (07/2023) Normal   PMH: Past Medical History:  Diagnosis Date   Hyperlipidemia    Hypertension     Surgical History: Past Surgical History:  Procedure Laterality Date   COLONOSCOPY     TOOTH EXTRACTION     WISDOM TOOTH EXTRACTION      Home Medications:  Allergies as of 08/17/2023       Reactions   Penicillins    Per pt - when a child, it made the fever go up instead of down        Medication List        Accurate as of August 16, 2023 10:25 PM. If you have any questions, ask your nurse or doctor.          atorvastatin 20 MG tablet Commonly known as: LIPITOR Take 20 mg by mouth daily.   doxycycline  100 MG capsule Commonly known as: VIBRAMYCIN  Take 1 capsule (100 mg total) by mouth 2 (two)  times daily.   guaiFENesin -codeine  100-10 MG/5ML syrup Take 5 mLs by mouth as directed.   lisinopril 10 MG tablet Commonly known as: ZESTRIL Take 5 mg by mouth daily.        Allergies:  Allergies  Allergen Reactions   Penicillins     Per pt - when a child, it made the fever go up instead of down    Family History: Family History  Problem Relation Age of Onset   Diabetes Mellitus II Mother    Dementia Mother    Stroke Father    Lung cancer Brother     Social History:  reports that he quit smoking about 35 years ago. His smoking use included cigarettes. He has never used smokeless tobacco. He reports that he does not currently use alcohol. He reports that he does not use drugs.  ROS: Pertinent ROS in HPI  Physical Exam: There were no vitals taken for this visit.  Constitutional:  Well nourished. Alert and oriented, No acute distress. HEENT: [place] AT, moist mucus membranes.  Trachea midline, no masses. Cardiovascular: No clubbing, cyanosis, or edema. Respiratory: Normal respiratory effort, no increased work of breathing. GI: Abdomen is soft, non tender, non distended, no abdominal masses. Liver and spleen not palpable.  No hernias appreciated.  Stool sample for occult  testing is not indicated.   GU: No CVA tenderness.  No bladder fullness or masses.  Patient with circumcised/uncircumcised phallus. ***Foreskin easily retracted***  Urethral meatus is patent.  No penile discharge. No penile lesions or rashes. Scrotum without lesions, cysts, rashes and/or edema.  Testicles are located scrotally bilaterally. No masses are appreciated in the testicles. Left and right epididymis are normal. Rectal: Patient with  normal sphincter tone. Anus and perineum without scarring or rashes. No rectal masses are appreciated. Prostate is approximately *** grams, *** nodules are appreciated. Seminal vesicles are normal. Skin: No rashes, bruises or suspicious lesions. Lymph: No cervical or inguinal  adenopathy. Neurologic: Grossly intact, no focal deficits, moving all 4 extremities. Psychiatric: Normal mood and affect.   Laboratory Data: N/A  Pertinent Imaging: CLINICAL DATA:  History of left epididymitis   EXAM: SCROTAL ULTRASOUND   DOPPLER ULTRASOUND OF THE TESTICLES   TECHNIQUE: Complete ultrasound examination of the testicles, epididymis, and other scrotal structures was performed. Color and spectral Doppler ultrasound were also utilized to evaluate blood flow to the testicles.   COMPARISON:  None Available.   FINDINGS: Right testicle   Measurements: 5.0 x 2.5 x 3.1 cm. No mass or microlithiasis visualized.   Left testicle   Measurements: 5.1 x 2.2 x 4.1 cm. No mass or microlithiasis visualized.   Right epididymis:  Normal in size and appearance.   Left epididymis:  Normal in size and appearance.   Hydrocele:  None visualized.   Varicocele:  None visualized.   Pulsed Doppler interrogation of both testes demonstrates normal low resistance arterial and venous waveforms bilaterally.   IMPRESSION: Unremarkable testicular ultrasound.     Electronically Signed   By: Norman Gatlin M.D.   On: 07/30/2023 20:52 I have independently reviewed the films.  See HPI.    Assessment & Plan:    1.  Left epididymitis - scrotal ultrasound was negative  2.  Tinea cruris -   No follow-ups on file.  These notes generated with voice recognition software. I apologize for typographical errors.  [name]  [facility] [address]  [address] [place], [place] [address] [phone]

## 2023-08-17 ENCOUNTER — Ambulatory Visit: Admitting: Urology

## 2023-08-17 DIAGNOSIS — N451 Epididymitis: Secondary | ICD-10-CM

## 2023-08-17 DIAGNOSIS — B356 Tinea cruris: Secondary | ICD-10-CM

## 2023-09-22 ENCOUNTER — Ambulatory Visit: Attending: Cardiology | Admitting: Cardiology
